# Patient Record
Sex: Female | Born: 1990 | Race: Black or African American | Hispanic: No | Marital: Single | Smoking: Current every day smoker
Health system: Southern US, Community
[De-identification: ages and names within clinical notes are randomized; demographics above are authoritative.]

## PROBLEM LIST (undated history)

## (undated) DIAGNOSIS — B977 Papillomavirus as the cause of diseases classified elsewhere: Secondary | ICD-10-CM

## (undated) DIAGNOSIS — A749 Chlamydial infection, unspecified: Secondary | ICD-10-CM

## (undated) DIAGNOSIS — K429 Umbilical hernia without obstruction or gangrene: Secondary | ICD-10-CM

## (undated) DIAGNOSIS — N189 Chronic kidney disease, unspecified: Secondary | ICD-10-CM

## (undated) DIAGNOSIS — B9689 Other specified bacterial agents as the cause of diseases classified elsewhere: Secondary | ICD-10-CM

## (undated) DIAGNOSIS — A5901 Trichomonal vulvovaginitis: Secondary | ICD-10-CM

## (undated) DIAGNOSIS — N760 Acute vaginitis: Secondary | ICD-10-CM

## (undated) DIAGNOSIS — Z789 Other specified health status: Secondary | ICD-10-CM

## (undated) DIAGNOSIS — M419 Scoliosis, unspecified: Secondary | ICD-10-CM

## (undated) HISTORY — PX: WISDOM TOOTH EXTRACTION: SHX21

## (undated) HISTORY — DX: Trichomonal vulvovaginitis: A59.01

---
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
## 2012-01-26 ENCOUNTER — Encounter (HOSPITAL_COMMUNITY): Payer: Self-pay | Admitting: Emergency Medicine

## 2012-01-26 ENCOUNTER — Emergency Department (HOSPITAL_COMMUNITY)
Admission: EM | Admit: 2012-01-26 | Discharge: 2012-01-27 | Disposition: A | Discharge status: Home / Self Care | Payer: Medicaid Other | Attending: Emergency Medicine | Admitting: Emergency Medicine

## 2012-01-26 DIAGNOSIS — B349 Viral infection, unspecified: Secondary | ICD-10-CM

## 2012-01-26 DIAGNOSIS — F17200 Nicotine dependence, unspecified, uncomplicated: Secondary | ICD-10-CM | POA: Insufficient documentation

## 2012-01-26 DIAGNOSIS — M4120 Other idiopathic scoliosis, site unspecified: Secondary | ICD-10-CM | POA: Insufficient documentation

## 2012-01-26 DIAGNOSIS — R509 Fever, unspecified: Secondary | ICD-10-CM

## 2012-01-26 DIAGNOSIS — B9789 Other viral agents as the cause of diseases classified elsewhere: Secondary | ICD-10-CM | POA: Insufficient documentation

## 2012-01-26 LAB — URINALYSIS, ROUTINE W REFLEX MICROSCOPIC
Nitrite: NEGATIVE
Protein, ur: NEGATIVE mg/dL
Specific Gravity, Urine: 1.022 (ref 1.005–1.030)
Urobilinogen, UA: 1 mg/dL (ref 0.0–1.0)

## 2012-01-26 LAB — COMPREHENSIVE METABOLIC PANEL
AST: 17 U/L (ref 0–37)
Albumin: 3.9 g/dL (ref 3.5–5.2)
Chloride: 102 mEq/L (ref 96–112)
GFR calc Af Amer: >90 mL/min (ref >90)
GFR calc non Af Amer: >90 mL/min (ref >90)
Potassium: 3.8 mEq/L (ref 3.5–5.1)
Sodium: 137 mEq/L (ref 135–145)
Total Bilirubin: 0.3 mg/dL (ref 0.3–1.2)
Total Protein: 7.8 g/dL (ref 6.0–8.3)

## 2012-01-26 LAB — POCT PREGNANCY, URINE: Preg Test, Ur: NEGATIVE

## 2012-01-26 LAB — URINE MICROSCOPIC-ADD ON

## 2012-01-26 LAB — CBC WITH DIFFERENTIAL/PLATELET
Basophils Relative: 0 % (ref 0–1)
Eosinophils Absolute: 0.1 10*3/uL (ref 0.0–0.7)
Lymphs Abs: 1.7 10*3/uL (ref 0.7–4.0)
MCH: 30.9 pg (ref 26.0–34.0)
MCHC: 34.4 g/dL (ref 30.0–36.0)
Neutro Abs: 5.3 10*3/uL (ref 1.7–7.7)
Neutrophils Relative %: 68 % (ref 43–77)
Platelets: 274 10*3/uL (ref 150–400)
RBC: 4.37 MIL/uL (ref 3.87–5.11)

## 2012-01-26 MED ORDER — ACETAMINOPHEN 325 MG PO TABS
ORAL_TABLET | ORAL | Status: AC
  Administered 2012-01-26: 650 mg via ORAL
  Filled 2012-01-26: qty 2

## 2012-01-26 MED ORDER — ACETAMINOPHEN 325 MG PO TABS
650.0000 mg | ORAL_TABLET | Freq: Once | ORAL | Status: AC
  Administered 2012-01-26: 650 mg via ORAL

## 2012-01-26 NOTE — ED Notes (Signed)
Pt requesting to leave due to wait time, RN spoke with pt and calmed her down. Pt agrred to stay a little while longer.

## 2012-01-26 NOTE — ED Notes (Signed)
Patient complaining of fever, chills, body aches, and sore throat that started yesterday morning.  Denies shortness of breath and chest pain.

## 2012-01-27 NOTE — ED Notes (Signed)
Pt discharged.GCS 15 and she says she feels better.

## 2012-01-27 NOTE — ED Notes (Signed)
Pt presented to ED with soar throat and painfull to swallow.

## 2012-01-28 NOTE — ED Provider Notes (Signed)
History     CSN: [id number]  Arrival date & time 01/26/12  2108   First MD Initiated Contact with Patient 01/27/12 0013      Chief Complaint  Patient presents with  . Fever  . Generalized Body Aches    (Consider location/radiation/quality/duration/timing/severity/associated sxs/prior treatment) HPI 21 year old female presents to emergency apartment complaining of fever chills body aches, headache, sore throat. She has had diffuse myalgias and malaise. No known sick contacts. Patient took Tylenol and Motrin once with some improvement in symptoms but none since. Patient is eating and drinking well. She denies any Raynaud's, congestion cough chest pain or abdominal pain. Past Medical History  Diagnosis Date  . Scoliosis     History reviewed. No pertinent past surgical history.  History reviewed. No pertinent family history.  History  Substance Use Topics  . Smoking status: Current Everyday Smoker  . Smokeless tobacco: Not on file  . Alcohol Use: No    OB History    Grav Para Term Preterm Abortions TAB SAB Ect Mult Living                  Review of Systems  All other systems reviewed and are negative.    Allergies  Review of patient's allergies indicates no known allergies.  Home Medications   Current Outpatient Rx  Name Route Sig Dispense Refill  . IMPLANON [place] Subcutaneous Inject 1 application into the skin as directed.    . IBUPROFEN 200 MG PO TABS Oral Take 800 mg by mouth every 8 (eight) hours as needed. For pain      BP 116/53  Pulse 103  Temp 101 F (38.3 C) (Oral)  Resp 16  SpO2 97%  LMP 12/27/2011  Physical Exam  Nursing note and vitals reviewed. Constitutional: She is oriented to person, place, and time.       Morbidly obese female in no acute distress laughing with her family  HENT:  Head: Normocephalic and atraumatic.  Right Ear: External ear normal.  Left Ear: External ear normal.  Nose: Nose normal.  Mouth/Throat: Oropharynx is clear  and moist. No oropharyngeal exudate.  Eyes: Conjunctivae are normal. Pupils are equal, round, and reactive to light.  Neck: Normal range of motion. Neck supple. No JVD present. No tracheal deviation present. No thyromegaly present.  Cardiovascular: Normal rate, regular rhythm and intact distal pulses.  Exam reveals no gallop and no friction rub.   No murmur heard. Pulmonary/Chest: Effort normal and breath sounds normal. No stridor. No respiratory distress. She has no wheezes. She has no rales. She exhibits no tenderness.  Abdominal: Soft. Bowel sounds are normal. She exhibits no distension and no mass. There is no tenderness. There is no rebound and no guarding.  Musculoskeletal: Normal range of motion. She exhibits no edema and no tenderness.  Lymphadenopathy:    She has no cervical adenopathy.  Neurological: She is alert and oriented to person, place, and time. She exhibits normal muscle tone. Coordination normal.  Skin: Skin is warm and dry. No rash noted. No erythema. No pallor.    ED Course  Procedures (including critical care time)  Labs Reviewed  URINALYSIS, ROUTINE W REFLEX MICROSCOPIC - Abnormal; Notable for the following:    APPearance CLOUDY (*)     Leukocytes, UA TRACE (*)     All other components within normal limits  URINE MICROSCOPIC-ADD ON - Abnormal; Notable for the following:    Squamous Epithelial / LPF MANY (*)     Bacteria, UA  MANY (*)     All other components within normal limits  CBC WITH DIFFERENTIAL  COMPREHENSIVE METABOLIC PANEL  POCT PREGNANCY, URINE  RAPID STREP SCREEN  LAB REPORT - SCANNED   No results found.   1. Fever   2. Viral syndrome       MDM  21 year old otherwise healthy female with viral syndrome with fever. Patient counseled drink plan fluids alternate Tylenol and Motrin for fever chills and body aches.        Olivia Mackie, MD 01/28/12 [phone]

## 2012-05-05 ENCOUNTER — Emergency Department (HOSPITAL_COMMUNITY)
Admission: EM | Admit: 2012-05-05 | Discharge: 2012-05-05 | Disposition: A | Discharge status: Home / Self Care | Payer: Medicaid Other | Attending: Emergency Medicine | Admitting: Emergency Medicine

## 2012-05-05 ENCOUNTER — Encounter (HOSPITAL_COMMUNITY): Payer: Self-pay | Admitting: *Deleted

## 2012-05-05 DIAGNOSIS — F17200 Nicotine dependence, unspecified, uncomplicated: Secondary | ICD-10-CM | POA: Insufficient documentation

## 2012-05-05 DIAGNOSIS — R21 Rash and other nonspecific skin eruption: Secondary | ICD-10-CM

## 2012-05-05 MED ORDER — DIPHENHYDRAMINE HCL 25 MG PO CAPS
25.0000 mg | ORAL_CAPSULE | Freq: Once | ORAL | Status: AC
  Administered 2012-05-05: 25 mg via ORAL
  Filled 2012-05-05: qty 1

## 2012-05-05 MED ORDER — DEXAMETHASONE 6 MG PO TABS: 6.0000 mg | ORAL_TABLET | Freq: Two times a day (BID) | ORAL | Status: DC

## 2012-05-05 MED ORDER — DEXAMETHASONE 2 MG PO TABS
6.0000 mg | ORAL_TABLET | Freq: Once | ORAL | Status: AC
  Administered 2012-05-05: 6 mg via ORAL
  Filled 2012-05-05: qty 3

## 2012-05-05 MED ORDER — DIPHENHYDRAMINE HCL 25 MG PO TABS: 25.0000 mg | ORAL_TABLET | ORAL | Status: DC | PRN

## 2012-05-05 NOTE — ED Provider Notes (Signed)
History     CSN: [id number]  Arrival date & time 05/05/12  2306   First MD Initiated Contact with Patient 05/05/12 2316      Chief Complaint  Patient presents with  . Rash    (Consider location/radiation/quality/duration/timing/severity/associated sxs/prior treatment) HPI Comments: 21 year old female presents emergency department complaining of a rash on her left arm for the past 2 weeks. She states when he she just noticed a rash, and states it has not spread or changed. She describes rash as small bumps located on her upper arm. It is very itchy but not painful. Denies new soaps, detergents, pets or recent travel. No new medications or recent illness. Denies any associated symptoms including fever, chills, difficulty breathing or swallowing. She has not tried any alleviating factors.  Patient is a 21 y.o. female presenting with rash. The history is provided by the patient.  Rash     Past Medical History  Diagnosis Date  . Scoliosis     History reviewed. No pertinent past surgical history.  History reviewed. No pertinent family history.  History  Substance Use Topics  . Smoking status: Current Every Day Smoker  . Smokeless tobacco: Not on file  . Alcohol Use: No    OB History    Grav Para Term Preterm Abortions TAB SAB Ect Mult Living                  Review of Systems  Constitutional: Negative for fever and chills.  HENT: Negative for facial swelling, trouble swallowing, neck pain and neck stiffness.   Respiratory: Negative for shortness of breath.   Cardiovascular: Negative for chest pain and palpitations.  Gastrointestinal: Negative for nausea and vomiting.  Musculoskeletal: Negative for arthralgias.  Skin: Positive for rash. Negative for color change.    Allergies  Review of patient's allergies indicates no known allergies.  Home Medications   Current Outpatient Rx  Name Route Sig Dispense Refill  . IMPLANON [place] Subcutaneous Inject 1 application into  the skin as directed.    . IBUPROFEN 200 MG PO TABS Oral Take 800 mg by mouth every 8 (eight) hours as needed. For pain      BP 107/72  Pulse 88  Temp 98.4 F (36.9 C) (Oral)  Resp 16  SpO2 100%  Physical Exam  Nursing note and vitals reviewed. Constitutional: She is oriented to person, place, and time. She appears well-developed and well-nourished. No distress.  HENT:  Head: Normocephalic and atraumatic.  Mouth/Throat: Oropharynx is clear and moist. No oral lesions.  Eyes: Conjunctivae normal and EOM are normal.  Neck: Normal range of motion. Neck supple.  Cardiovascular: Normal rate, regular rhythm and normal heart sounds.   Pulmonary/Chest: Effort normal and breath sounds normal.  Musculoskeletal: Normal range of motion.  Neurological: She is alert and oriented to person, place, and time.  Skin: Skin is warm, dry and intact. Rash noted. Rash is maculopapular (pinpoint maculopapular lesions on lateral aspect of left arm. no erythema or warmth.). She is not diaphoretic.  Psychiatric: She has a normal mood and affect. Her speech is normal and behavior is normal.    ED Course  Procedures (including critical care time)  Labs Reviewed - No data to display No results found.   1. Rash/skin eruption       MDM  21 year old female with maculopapular rash that is itchy. She has not had any alleviating factors. No new soaps, detergents, perhaps, recent illness or travel. No new medications. She's afebrile with normal  vital signs and in no apparent distress. There is no evidence of secondary infection. I will treat her with oral Decadron in the emergency department along with a prescription for one more Decadron to take tomorrow. I also prescribed Benadryl for her itching. Return precautions discussed.      Trevor Mace, PA-C 05/05/12 2337

## 2012-05-05 NOTE — ED Notes (Signed)
Pt states left arm rash for the past two weeks. Rash is small bumps localized on pt left upper arm.

## 2012-05-05 NOTE — ED Notes (Signed)
Pt c/o rash on left upper arm x's 2 weeks.

## 2012-05-06 NOTE — ED Provider Notes (Signed)
Medical screening examination/treatment/procedure(s) were performed by non-physician practitioner and as supervising physician I was immediately available for consultation/collaboration.  Olivia Mackie, MD 05/06/12 [phone]

## 2012-06-22 ENCOUNTER — Encounter: Payer: Medicaid Other | Admitting: Family Medicine

## 2012-07-03 ENCOUNTER — Encounter: Payer: Medicaid Other | Admitting: Family Medicine

## 2012-07-29 ENCOUNTER — Encounter: Payer: Medicaid Other | Admitting: Family Medicine

## 2012-08-06 ENCOUNTER — Encounter: Payer: Medicaid Other | Admitting: Family Medicine

## 2012-08-15 ENCOUNTER — Inpatient Hospital Stay (HOSPITAL_COMMUNITY)
Admission: AD | Admit: 2012-08-15 | Discharge: 2012-08-15 | Disposition: A | Discharge status: Home / Self Care | Payer: Medicaid Other | Source: Ambulatory Visit | Attending: Obstetrics and Gynecology | Admitting: Obstetrics and Gynecology

## 2012-08-15 ENCOUNTER — Encounter (HOSPITAL_COMMUNITY): Payer: Self-pay

## 2012-08-15 DIAGNOSIS — R109 Unspecified abdominal pain: Secondary | ICD-10-CM | POA: Insufficient documentation

## 2012-08-15 DIAGNOSIS — J069 Acute upper respiratory infection, unspecified: Secondary | ICD-10-CM | POA: Insufficient documentation

## 2012-08-15 DIAGNOSIS — N72 Inflammatory disease of cervix uteri: Secondary | ICD-10-CM

## 2012-08-15 DIAGNOSIS — R102 Pelvic and perineal pain: Secondary | ICD-10-CM

## 2012-08-15 DIAGNOSIS — N949 Unspecified condition associated with female genital organs and menstrual cycle: Secondary | ICD-10-CM | POA: Insufficient documentation

## 2012-08-15 HISTORY — DX: Other specified health status: Z78.9

## 2012-08-15 LAB — URINALYSIS, ROUTINE W REFLEX MICROSCOPIC
Ketones, ur: NEGATIVE mg/dL
Protein, ur: NEGATIVE mg/dL
Urobilinogen, UA: 0.2 mg/dL (ref 0.0–1.0)

## 2012-08-15 LAB — WET PREP, GENITAL
Trich, Wet Prep: NONE SEEN
Yeast Wet Prep HPF POC: NONE SEEN

## 2012-08-15 LAB — CBC WITH DIFFERENTIAL/PLATELET
Basophils Absolute: 0 10*3/uL (ref 0.0–0.1)
Eosinophils Absolute: 0.2 10*3/uL (ref 0.0–0.7)
Eosinophils Relative: 4 % (ref 0–5)
Lymphs Abs: 2.8 10*3/uL (ref 0.7–4.0)
MCH: 30.1 pg (ref 26.0–34.0)
MCV: 92.8 fL (ref 78.0–100.0)
Neutrophils Relative %: 40 % — ABNORMAL LOW (ref 43–77)
Platelets: 277 10*3/uL (ref 150–400)
RBC: 4.05 MIL/uL (ref 3.87–5.11)
RDW: 12.8 % (ref 11.5–15.5)
WBC: 6.3 10*3/uL (ref 4.0–10.5)

## 2012-08-15 LAB — POCT PREGNANCY, URINE: Preg Test, Ur: NEGATIVE

## 2012-08-15 MED ORDER — AZITHROMYCIN 250 MG PO TABS
1000.0000 mg | ORAL_TABLET | Freq: Once | ORAL | Status: AC
  Administered 2012-08-15: 1000 mg via ORAL
  Filled 2012-08-15: qty 4

## 2012-08-15 MED ORDER — CEFTRIAXONE SODIUM 250 MG IJ SOLR
250.0000 mg | Freq: Once | INTRAMUSCULAR | Status: DC
Start: 2012-08-15 — End: 2012-08-15
  Filled 2012-08-15: qty 250

## 2012-08-15 MED ORDER — NAPROXEN SODIUM 550 MG PO TABS: 550.0000 mg | ORAL_TABLET | Freq: Two times a day (BID) | ORAL | Status: AC

## 2012-08-15 NOTE — MAU Provider Note (Signed)
Attestation of Attending Supervision of Advanced Practitioner (CNM/NP): Evaluation and management procedures were performed by the Advanced Practitioner under my supervision and collaboration.  I have reviewed the Advanced Practitioner's note and chart, and I agree with the management and plan.  Lynsee Wands 08/15/2012 9:30 PM

## 2012-08-15 NOTE — MAU Note (Signed)
Pt states that she has been having abdominal for a couple of days. States home pregnancy test was positive about 1 month ago

## 2012-08-15 NOTE — MAU Provider Note (Signed)
History     CSN: [id number]  Arrival date and time: 08/15/12 1558   First Provider Initiated Contact with Patient 08/15/12 1734      Chief Complaint  Patient presents with  . Abdominal Pain   HPI Catherine Morales is a 22 y.o. female who presents to MAU with pelvic pain. The pain has been going on a couple days. She had vaginal bleeding last week and thinks it was her period but was heavier than usual so she wants a pregnancy test. She is having some pain when she has sexual intercourse. She is here with 2 of her friend that are also patients in MAU. She rates her pain as 8/10. She is texting during the interview and I have to repeat question. She also has a cold and congestion. The history was provided by the patient.   OB History    Grav Para Term Preterm Abortions TAB SAB Ect Mult Living   1 1 1       1       Past Medical History  Diagnosis Date  . Scoliosis   . No pertinent past medical history     No past surgical history on file.  No family history on file.  History  Substance Use Topics  . Smoking status: Current Every Day Smoker  . Smokeless tobacco: Not on file  . Alcohol Use: No    Allergies: No Known Allergies  Prescriptions prior to admission  Medication Sig Dispense Refill  . Etonogestrel (IMPLANON [place]) Inject 1 application into the skin as directed.        Review of Systems  Constitutional: Negative for fever.  HENT: Positive for congestion.   Eyes: Negative for blurred vision and double vision.  Respiratory: Positive for cough. Negative for wheezing.   Cardiovascular: Negative for chest pain and palpitations.  Gastrointestinal: Positive for abdominal pain. Negative for nausea, vomiting, diarrhea and constipation.  Genitourinary: Negative for dysuria and frequency.  Musculoskeletal: Positive for back pain.  Skin: Negative for rash.  Neurological: Positive for headaches. Negative for dizziness and seizures.  Psychiatric/Behavioral: Negative for  depression. The patient is not nervous/anxious and does not have insomnia.    Blood pressure 119/60, pulse 82, temperature 98 F (36.7 C), temperature source Oral, resp. rate 16, height 5\' 6"  (1.676 m), weight 220 lb (99.791 kg), last menstrual period 06/03/2012.  Physical Exam  Constitutional: She is oriented to person, place, and time. She appears well-developed and well-nourished. No distress.  HENT:  Head: Normocephalic and atraumatic.  Eyes: EOM are normal.  Neck: Neck supple.  Cardiovascular: Normal rate.   Respiratory: Effort normal.  GI: Soft. Normal appearance. There is tenderness in the right lower quadrant, suprapubic area and left lower quadrant. There is no CVA tenderness.  Genitourinary:       External genitalia without lesions. Scant blood vaginal vault. Cervix inflamed, positive CMT, Bilateral adnexal tenderness. Uterus without palpable enlargement.  Musculoskeletal: Normal range of motion.  Neurological: She is alert and oriented to person, place, and time.  Skin: Skin is warm and dry.  Psychiatric: She has a normal mood and affect. Her behavior is normal. Judgment and thought content normal.   Results for orders placed during the hospital encounter of 08/15/12 (from the past 24 hour(s))  URINALYSIS, ROUTINE W REFLEX MICROSCOPIC     Status: Abnormal   Collection Time   08/15/12  5:05 PM      Component Value Range   Color, Urine YELLOW  YELLOW  APPearance CLOUDY (*) CLEAR   Specific Gravity, Urine 1.025  1.005 - 1.030   pH 7.5  5.0 - 8.0   Glucose, UA NEGATIVE  NEGATIVE mg/dL   Hgb urine dipstick NEGATIVE  NEGATIVE   Bilirubin Urine NEGATIVE  NEGATIVE   Ketones, ur NEGATIVE  NEGATIVE mg/dL   Protein, ur NEGATIVE  NEGATIVE mg/dL   Urobilinogen, UA 0.2  0.0 - 1.0 mg/dL   Nitrite NEGATIVE  NEGATIVE   Leukocytes, UA TRACE (*) NEGATIVE  URINE MICROSCOPIC-ADD ON     Status: Abnormal   Collection Time   08/15/12  5:05 PM      Component Value Range   Squamous  Epithelial / LPF FEW (*) RARE   WBC, UA 0-2  <3 WBC/hpf   Urine-Other MUCOUS PRESENT    POCT PREGNANCY, URINE     Status: Normal   Collection Time   08/15/12  5:14 PM      Component Value Range   Preg Test, Ur NEGATIVE  NEGATIVE  CBC WITH DIFFERENTIAL     Status: Abnormal   Collection Time   08/15/12  5:46 PM      Component Value Range   WBC 6.3  4.0 - 10.5 K/uL   RBC 4.05  3.87 - 5.11 MIL/uL   Hemoglobin 12.2  12.0 - 15.0 g/dL   HCT 16.1  09.6 - 04.5 %   MCV 92.8  78.0 - 100.0 fL   MCH 30.1  26.0 - 34.0 pg   MCHC 32.4  30.0 - 36.0 g/dL   RDW 40.9  81.1 - 91.4 %   Platelets 277  150 - 400 K/uL   Neutrophils Relative 40 (*) 43 - 77 %   Neutro Abs 2.5  1.7 - 7.7 K/uL   Lymphocytes Relative 45  12 - 46 %   Lymphs Abs 2.8  0.7 - 4.0 K/uL   Monocytes Relative 12  3 - 12 %   Monocytes Absolute 0.7  0.1 - 1.0 K/uL   Eosinophils Relative 4  0 - 5 %   Eosinophils Absolute 0.2  0.0 - 0.7 K/uL   Basophils Relative 1  0 - 1 %   Basophils Absolute 0.0  0.0 - 0.1 K/uL  WET PREP, GENITAL     Status: Abnormal   Collection Time   08/15/12  6:17 PM      Component Value Range   Yeast Wet Prep HPF POC NONE SEEN  NONE SEEN   Trich, Wet Prep NONE SEEN  NONE SEEN   Clue Cells Wet Prep HPF POC FEW (*) NONE SEEN   WBC, Wet Prep HPF POC FEW (*) NONE SEEN    Procedures Assessment: 22 y.o. female with pelvic pain   URI  Plan:  Rocephin 250 mg IM   (patient refused)   Zithromax 1 gram PO  I discussed with the patient in detail that since she has had STD's in the past that she may have the pain and cervicitis due to another infection. I discussed with her that if she does not take the antibiotics that her symptoms may worsen.     Medication List     As of 08/15/2012  6:58 PM    START taking these medications         naproxen sodium 550 MG tablet   Commonly known as: ANAPROX   Take 1 tablet (550 mg total) by mouth 2 (two) times daily with a meal.      CONTINUE taking these medications  IMPLANON [place]          Where to get your medications    These are the prescriptions that you need to pick up. We sent them to a specific pharmacy, so you will need to go there to get them.   [id number] - [place], [place] - [address] AT [facility] [address]    [address] [place] [address]    Phone: [phone]        naproxen sodium 550 MG tablet            NEESE,HOPE, RN, FNP, [facility] 08/15/2012, 6:56 PM

## 2012-08-18 LAB — GC/CHLAMYDIA PROBE AMP: CT Probe RNA: NEGATIVE

## 2012-08-27 ENCOUNTER — Encounter: Payer: Medicaid Other | Admitting: Family Medicine

## 2014-05-23 ENCOUNTER — Encounter (HOSPITAL_COMMUNITY): Payer: Self-pay

## 2014-11-17 ENCOUNTER — Emergency Department (HOSPITAL_COMMUNITY)
Admission: EM | Admit: 2014-11-17 | Discharge: 2014-11-18 | Disposition: A | Discharge status: Home / Self Care | Payer: Medicaid Other | Attending: Emergency Medicine | Admitting: Emergency Medicine

## 2014-11-17 ENCOUNTER — Emergency Department (HOSPITAL_COMMUNITY): Payer: Medicaid Other

## 2014-11-17 ENCOUNTER — Encounter (HOSPITAL_COMMUNITY): Payer: Self-pay | Admitting: *Deleted

## 2014-11-17 DIAGNOSIS — Z3202 Encounter for pregnancy test, result negative: Secondary | ICD-10-CM | POA: Diagnosis not present

## 2014-11-17 DIAGNOSIS — M419 Scoliosis, unspecified: Secondary | ICD-10-CM | POA: Diagnosis not present

## 2014-11-17 DIAGNOSIS — S6392XA Sprain of unspecified part of left wrist and hand, initial encounter: Secondary | ICD-10-CM | POA: Diagnosis not present

## 2014-11-17 DIAGNOSIS — Z720 Tobacco use: Secondary | ICD-10-CM | POA: Diagnosis not present

## 2014-11-17 DIAGNOSIS — Y929 Unspecified place or not applicable: Secondary | ICD-10-CM | POA: Diagnosis not present

## 2014-11-17 DIAGNOSIS — Y999 Unspecified external cause status: Secondary | ICD-10-CM | POA: Diagnosis not present

## 2014-11-17 DIAGNOSIS — S6992XA Unspecified injury of left wrist, hand and finger(s), initial encounter: Secondary | ICD-10-CM | POA: Diagnosis present

## 2014-11-17 DIAGNOSIS — Y939 Activity, unspecified: Secondary | ICD-10-CM | POA: Insufficient documentation

## 2014-11-17 DIAGNOSIS — M79642 Pain in left hand: Secondary | ICD-10-CM

## 2014-11-17 DIAGNOSIS — W1839XA Other fall on same level, initial encounter: Secondary | ICD-10-CM | POA: Insufficient documentation

## 2014-11-17 LAB — POC URINE PREG, ED: Preg Test, Ur: NEGATIVE

## 2014-11-17 NOTE — ED Notes (Addendum)
Pt in c/o injury to her left hand and wrist after a fall tonight, swelling noted, CMS intact, no distress

## 2014-11-18 MED ORDER — NAPROXEN 250 MG PO TABS
500.0000 mg | ORAL_TABLET | Freq: Once | ORAL | Status: AC
  Administered 2014-11-18: 500 mg via ORAL
  Filled 2014-11-18: qty 2

## 2014-11-18 MED ORDER — NAPROXEN 500 MG PO TABS: 500.0000 mg | ORAL_TABLET | Freq: Two times a day (BID) | ORAL | Status: DC | PRN

## 2014-11-18 NOTE — Progress Notes (Signed)
Orthopedic Tech Progress Note Patient Details:  Catherine Morales April 24, 1991 [id number]  Ortho Devices Ortho Device/Splint Interventions: Application   Haskell FlirtNewsome, Bach Rocchi M 11/18/2014, 12:44 AM

## 2014-11-18 NOTE — Discharge Instructions (Signed)
Wear wrist brace as needed for comfort. Ice and elevate hand/wrist throughout the day. Alternate between naprosyn and tylenol for pain. Call orthopedic follow up today or tomorrow to schedule followup appointment for recheck of ongoing wrist/hand pain in 1-2 weeks that can be canceled with a 24-48 hour notice if complete resolution of pain. Return to the ER for changes or worsening symptoms.   Musculoskeletal Pain Musculoskeletal pain is muscle and boney aches and pains. These pains can occur in any part of the body. Your caregiver may treat you without knowing the cause of the pain. They may treat you if blood or urine tests, X-rays, and other tests were normal.  CAUSES There is often not a definite cause or reason for these pains. These pains may be caused by a type of germ (virus). The discomfort may also come from overuse. Overuse includes working out too hard when your body is not fit. Boney aches also come from weather changes. Bone is sensitive to atmospheric pressure changes. HOME CARE INSTRUCTIONS   Ask when your test results will be ready. Make sure you get your test results.  Only take over-the-counter or prescription medicines for pain, discomfort, or fever as directed by your caregiver. If you were given medications for your condition, do not drive, operate machinery or power tools, or sign legal documents for 24 hours. Do not drink alcohol. Do not take sleeping pills or other medications that may interfere with treatment.  Continue all activities unless the activities cause more pain. When the pain lessens, slowly resume normal activities. Gradually increase the intensity and duration of the activities or exercise.  During periods of severe pain, bed rest may be helpful. Lay or sit in any position that is comfortable.  Putting ice on the injured area.  Put ice in a bag.  Place a towel between your skin and the bag.  Leave the ice on for 15 to 20 minutes, 3 to 4 times a  day.  Follow up with your caregiver for continued problems and no reason can be found for the pain. If the pain becomes worse or does not go away, it may be necessary to repeat tests or do additional testing. Your caregiver may need to look further for a possible cause. SEEK IMMEDIATE MEDICAL CARE IF:  You have pain that is getting worse and is not relieved by medications.  You develop chest pain that is associated with shortness or breath, sweating, feeling sick to your stomach (nauseous), or throw up (vomit).  Your pain becomes localized to the abdomen.  You develop any new symptoms that seem different or that concern you. MAKE SURE YOU:   Understand these instructions.  Will watch your condition.  Will get help right away if you are not doing well or get worse. Document Released: 07/08/2005 Document Revised: 09/30/2011 Document Reviewed: 03/12/2013 [facility] Patient Information 2015 [place], [place]. This information is not intended to replace advice given to you by your health care provider. Make sure you discuss any questions you have with your health care provider.  Cryotherapy Cryotherapy is when you put ice on your injury. Ice helps lessen pain and puffiness (swelling) after an injury. Ice works the best when you start using it in the first 24 to 48 hours after an injury. HOME CARE  Put a dry or damp towel between the ice pack and your skin.  You may press gently on the ice pack.  Leave the ice on for no more than 10 to 20  minutes at a time.  Check your skin after 5 minutes to make sure your skin is okay.  Rest at least 20 minutes between ice pack uses.  Stop using ice when your skin loses feeling (numbness).  Do not use ice on someone who cannot tell you when it hurts. This includes small children and people with memory problems (dementia). GET HELP RIGHT AWAY IF:  You have white spots on your skin.  Your skin turns blue or pale.  Your skin feels waxy or  hard.  Your puffiness gets worse. MAKE SURE YOU:   Understand these instructions.  Will watch your condition.  Will get help right away if you are not doing well or get worse. Document Released: 12/25/2007 Document Revised: 09/30/2011 Document Reviewed: 02/28/2011 [facility] Patient Information 2015 [place], [place]. This information is not intended to replace advice given to you by your health care provider. Make sure you discuss any questions you have with your health care provider.

## 2014-11-18 NOTE — ED Provider Notes (Signed)
CSN: [id number]     Arrival date & time 11/17/14  2255 History   First MD Initiated Contact with Patient 11/18/14 0021     Chief Complaint  Patient presents with  . Hand Injury     (Consider location/radiation/quality/duration/timing/severity/associated sxs/prior Treatment) HPI Comments: Catherine Morales is a 24 y.o. female with a PMHx of scoliosis, who presents to the ED with complaints of left hand pain that began after she fell on outstretched hand approximately 2 hours prior to arrival. She reports that the pain is 8/10 sore, constant, located to the fourth and fifth metacarpals, nonradiating, worse with movement, and with no known alleviating factors given that she has not tried anything prior to arrival. She endorses associated swelling, and pain with movement of her fingers but able to still wiggle them. She denies any numbness, tingling, weakness, head injury or loss of consciousness, elbow pain, wrist pain, back or neck pain, abrasions, or bruising. No skin injury. No other injuries sustained.  Patient is a 24 y.o. female presenting with hand injury. The history is provided by the patient. No language interpreter was used.  Hand Injury Location:  Hand Time since incident:  2 hours Injury: yes   Mechanism of injury comment:  FOOSH Hand location:  L hand Pain details:    Quality: soreness.   Radiates to:  Does not radiate   Severity:  Moderate   Onset quality:  Sudden   Duration:  2 hours   Timing:  Constant   Progression:  Unchanged Chronicity:  New Prior injury to area:  No Relieved by:  None tried Worsened by:  Movement Ineffective treatments:  None tried Associated symptoms: decreased range of motion (due to pain) and swelling   Associated symptoms: no back pain, no muscle weakness, no neck pain, no numbness and no tingling     Past Medical History  Diagnosis Date  . Scoliosis   . No pertinent past medical history    History reviewed. No pertinent past surgical  history. History reviewed. No pertinent family history. History  Substance Use Topics  . Smoking status: Current Every Day Smoker  . Smokeless tobacco: Not on file  . Alcohol Use: No   OB History    Gravida Para Term Preterm AB TAB SAB Ectopic Multiple Living   Review of Systems  HENT: Negative for facial swelling (no head injury).   Musculoskeletal: Positive for joint swelling (L hand) and arthralgias (L hand). Negative for myalgias, back pain and neck pain.  Skin: Negative for color change and wound.  Allergic/Immunologic: Negative for immunocompromised state.  Neurological: Negative for syncope, weakness, numbness and headaches.  Psychiatric/Behavioral: Negative for confusion.   10 Systems reviewed and are negative for acute change except as noted in the HPI.    Allergies  Review of patient's allergies indicates no known allergies.  Home Medications   Prior to Admission medications   Medication Sig Start Date End Date Taking? Authorizing Provider  Etonogestrel (IMPLANON [place]) Inject 1 application into the skin as directed.    Historical Provider, MD   BP 121/84 mmHg  Pulse 75  Temp(Src) 98.5 F (36.9 C) (Oral)  Resp 20  Ht  (1.651 m)  SpO2 99%  LMP 09/27/2014 Physical Exam  Constitutional: She is oriented to person, place, and time. Vital signs are normal. She appears well-developed and well-nourished.  Non-toxic appearance. No distress.  Afebrile, nontoxic, NAD  HENT:  Head: Normocephalic and atraumatic.  Mouth/Throat: Mucous membranes are normal.  Eyes: Conjunctivae and EOM are normal. Right eye exhibits no discharge. Left eye exhibits no discharge.  Neck: Normal range of motion. Neck supple.  Cardiovascular: Normal rate and intact distal pulses.   Pulmonary/Chest: Effort normal. No respiratory distress.  Abdominal: Normal appearance. She exhibits no distension.  Musculoskeletal: Normal range of motion.       Left hand: She exhibits  tenderness and bony tenderness. She exhibits normal range of motion, normal two-point discrimination, normal capillary refill, no deformity, no laceration and no swelling. Normal sensation noted. Normal strength noted.       Hands: L hand with FROM intact at all digits at PIP, MCP, and DIP joints, with mild TTP over 3rd-5th metacarpals, no deformities, no abrasions or skin changes, no swelling or bruising, cap refill brisk and present, distal pulses intact, strength and sensation grossly intact. L wrist and elbow nonTTP with FROM intact. Wiggles all digits.  Neurological: She is alert and oriented to person, place, and time. She has normal strength. No sensory deficit.  Skin: Skin is warm, dry and intact. No abrasion, no bruising and no rash noted.  No skin injury or bruising to L hand or any other exposed skin surfaces  Psychiatric: She has a normal mood and affect. Her behavior is normal.  Nursing note and vitals reviewed.   ED Course  Procedures (including critical care time) Labs Review Labs Reviewed  POC URINE PREG, ED    Imaging Review Dg Wrist Complete Left  11/17/2014   CLINICAL DATA:  24 year old female with left hand and wrist injury after falling tonight on the hardwood floor  EXAM: LEFT WRIST - COMPLETE 3+ VIEW  COMPARISON:  Concurrently obtained radiographs of the hand  FINDINGS: There is no evidence of fracture or dislocation. There is no evidence of arthropathy or other focal bone abnormality. Soft tissues are unremarkable.  IMPRESSION: Negative.   Electronically Signed   By: Malachy MoanHeath  McCullough M.D.   On: 11/17/2014 23:50   Dg Hand Complete Left  11/17/2014   CLINICAL DATA:  24 year old female with left hand and wrist pain after falling tonight tunnel hardwood floor  EXAM: LEFT HAND - COMPLETE 3+ VIEW  COMPARISON:  Concurrently obtained radiographs of the wrist  FINDINGS: There is no evidence of fracture or dislocation. There is no evidence of arthropathy or other focal bone  abnormality. Soft tissues are unremarkable.  IMPRESSION: Negative.   Electronically Signed   By: Malachy MoanHeath  McCullough M.D.   On: 11/17/2014 23:51     EKG Interpretation None      MDM   Final diagnoses:  Left hand pain  Hand sprain, left, initial encounter    24 y.o. female here with L hand pain after FOOSH injury. Neurovascularly intact with soft compartments. Minimal TTP to 3rd-5th metacarpals, FROM intact at all joints. Xrays negative. Will give naprosyn here, discussed RICE therapy and tylenol/naprosyn for pain. Will have her f/up with hand surgeon in 1-2 weeks for ongoing symptoms. Wrist splint provided for comfort. I explained the diagnosis and have given explicit precautions to return to the ER including for any other new or worsening symptoms. The patient understands and accepts the medical plan as it's been dictated and I have answered their questions. Discharge instructions concerning home care and prescriptions have been given. The patient is STABLE and is discharged to home in good condition.  BP 121/84 mmHg  Pulse 75  Temp(Src) 98.5 F (36.9 C) (Oral)  Resp 20  Ht  (1.651 m)  SpO2 99%  LMP 09/27/2014  Meds ordered this encounter  Medications  . naproxen (NAPROSYN) tablet 500 mg    Sig:   . naproxen (NAPROSYN) 500 MG tablet    Sig: Take 1 tablet (500 mg total) by mouth 2 (two) times daily as needed for mild pain, moderate pain or headache (TAKE WITH MEALS.).    Dispense:  20 tablet    Refill:  0       Cordell Guercio Camprubi-Soms, PA-C 11/18/14 1610  Loren Racer, MD 11/18/14 [phone]

## 2015-04-02 ENCOUNTER — Emergency Department (HOSPITAL_COMMUNITY): Payer: Medicaid Other

## 2015-04-02 ENCOUNTER — Observation Stay (HOSPITAL_COMMUNITY)
Admission: EM | Admit: 2015-04-02 | Discharge: 2015-04-03 | Disposition: A | Discharge status: Home / Self Care | Payer: Medicaid Other | Attending: Internal Medicine | Admitting: Internal Medicine

## 2015-04-02 ENCOUNTER — Encounter (HOSPITAL_COMMUNITY): Payer: Self-pay | Admitting: Emergency Medicine

## 2015-04-02 DIAGNOSIS — E872 Acidosis: Secondary | ICD-10-CM | POA: Insufficient documentation

## 2015-04-02 DIAGNOSIS — R05 Cough: Secondary | ICD-10-CM | POA: Insufficient documentation

## 2015-04-02 DIAGNOSIS — R0602 Shortness of breath: Secondary | ICD-10-CM | POA: Diagnosis not present

## 2015-04-02 DIAGNOSIS — B9620 Unspecified Escherichia coli [E. coli] as the cause of diseases classified elsewhere: Secondary | ICD-10-CM | POA: Diagnosis not present

## 2015-04-02 DIAGNOSIS — F17200 Nicotine dependence, unspecified, uncomplicated: Secondary | ICD-10-CM | POA: Insufficient documentation

## 2015-04-02 DIAGNOSIS — R509 Fever, unspecified: Secondary | ICD-10-CM | POA: Insufficient documentation

## 2015-04-02 DIAGNOSIS — M419 Scoliosis, unspecified: Secondary | ICD-10-CM | POA: Diagnosis not present

## 2015-04-02 DIAGNOSIS — N390 Urinary tract infection, site not specified: Secondary | ICD-10-CM | POA: Diagnosis not present

## 2015-04-02 DIAGNOSIS — N12 Tubulo-interstitial nephritis, not specified as acute or chronic: Secondary | ICD-10-CM | POA: Diagnosis present

## 2015-04-02 HISTORY — DX: Tubulo-interstitial nephritis, not specified as acute or chronic: N12

## 2015-04-02 LAB — URINE MICROSCOPIC-ADD ON

## 2015-04-02 LAB — CBC
HCT: 35.7 % — ABNORMAL LOW (ref 36.0–46.0)
Hemoglobin: 11.9 g/dL — ABNORMAL LOW (ref 12.0–15.0)
MCH: 30.7 pg (ref 26.0–34.0)
MCHC: 33.3 g/dL (ref 30.0–36.0)
MCV: 92.2 fL (ref 78.0–100.0)
PLATELETS: 271 10*3/uL (ref 150–400)
RBC: 3.87 MIL/uL (ref 3.87–5.11)
RDW: 12.5 % (ref 11.5–15.5)
WBC: 7.7 10*3/uL (ref 4.0–10.5)

## 2015-04-02 LAB — LIPASE, BLOOD: Lipase: 21 U/L — ABNORMAL LOW (ref 22–51)

## 2015-04-02 LAB — URINALYSIS, ROUTINE W REFLEX MICROSCOPIC
BILIRUBIN URINE: NEGATIVE
Glucose, UA: NEGATIVE mg/dL
KETONES UR: 15 mg/dL — AB
NITRITE: POSITIVE — AB
PH: 6 (ref 5.0–8.0)
PROTEIN: 30 mg/dL — AB
Specific Gravity, Urine: 1.026 (ref 1.005–1.030)
UROBILINOGEN UA: 0.2 mg/dL (ref 0.0–1.0)

## 2015-04-02 LAB — COMPREHENSIVE METABOLIC PANEL
ALK PHOS: 65 U/L (ref 38–126)
ALT: 14 U/L (ref 14–54)
AST: 25 U/L (ref 15–41)
Albumin: 3.5 g/dL (ref 3.5–5.0)
Anion gap: 9 (ref 5–15)
BILIRUBIN TOTAL: 0.9 mg/dL (ref 0.3–1.2)
BUN: 7 mg/dL (ref 6–20)
CALCIUM: 9.1 mg/dL (ref 8.9–10.3)
CO2: 23 mmol/L (ref 22–32)
CREATININE: 0.85 mg/dL (ref 0.44–1.00)
Chloride: 104 mmol/L (ref 101–111)
Glucose, Bld: 132 mg/dL — ABNORMAL HIGH (ref 65–99)
Potassium: 3.6 mmol/L (ref 3.5–5.1)
Sodium: 136 mmol/L (ref 135–145)
TOTAL PROTEIN: 6.7 g/dL (ref 6.5–8.1)

## 2015-04-02 LAB — I-STAT CG4 LACTIC ACID, ED
Lactic Acid, Venous: 3.65 mmol/L (ref 0.5–2.0)
Lactic Acid, Venous: 0.65 mmol/L (ref 0.5–2.0)

## 2015-04-02 LAB — I-STAT BETA HCG BLOOD, ED (MC, WL, AP ONLY): I-stat hCG, quantitative: <5 m[IU]/mL (ref <5)

## 2015-04-02 MED ORDER — SODIUM CHLORIDE 0.9 % IV BOLUS (SEPSIS)
2000.0000 mL | Freq: Once | INTRAVENOUS | Status: AC
Start: 2015-04-02 — End: 2015-04-02
  Administered 2015-04-02: 2000 mL via INTRAVENOUS

## 2015-04-02 MED ORDER — KETOROLAC TROMETHAMINE 30 MG/ML IJ SOLN
30.0000 mg | Freq: Once | INTRAMUSCULAR | Status: AC
  Administered 2015-04-02: 30 mg via INTRAVENOUS
  Filled 2015-04-02: qty 1

## 2015-04-02 MED ORDER — CEPHALEXIN 500 MG PO CAPS: 500.0000 mg | ORAL_CAPSULE | Freq: Four times a day (QID) | ORAL | Status: DC

## 2015-04-02 MED ORDER — ACETAMINOPHEN 325 MG PO TABS
650.0000 mg | ORAL_TABLET | Freq: Four times a day (QID) | ORAL | Status: DC | PRN
  Administered 2015-04-02: 650 mg via ORAL

## 2015-04-02 MED ORDER — SODIUM CHLORIDE 0.9 % IV BOLUS (SEPSIS)
500.0000 mL | Freq: Once | INTRAVENOUS | Status: AC
  Administered 2015-04-02: 500 mL via INTRAVENOUS

## 2015-04-02 MED ORDER — SODIUM CHLORIDE 0.9 % IV SOLN
INTRAVENOUS | Status: AC
  Administered 2015-04-02 – 2015-04-03 (×2): via INTRAVENOUS

## 2015-04-02 MED ORDER — ACETAMINOPHEN 500 MG PO TABS
1000.0000 mg | ORAL_TABLET | Freq: Once | ORAL | Status: AC
Start: 2015-04-02 — End: 2015-04-02
  Administered 2015-04-02: 1000 mg via ORAL
  Filled 2015-04-02: qty 2

## 2015-04-02 MED ORDER — DEXTROSE 5 % IV SOLN
1.0000 g | INTRAVENOUS | Status: DC
  Administered 2015-04-03: 1 g via INTRAVENOUS
  Filled 2015-04-02: qty 10

## 2015-04-02 MED ORDER — MORPHINE SULFATE (PF) 4 MG/ML IV SOLN
4.0000 mg | Freq: Once | INTRAVENOUS | Status: AC
  Administered 2015-04-02: 4 mg via INTRAVENOUS
  Filled 2015-04-02: qty 1

## 2015-04-02 MED ORDER — SODIUM CHLORIDE 0.9 % IV BOLUS (SEPSIS)
2000.0000 mL | Freq: Once | INTRAVENOUS | Status: AC
  Administered 2015-04-02: 2000 mL via INTRAVENOUS

## 2015-04-02 MED ORDER — ACETAMINOPHEN 500 MG PO TABS
1000.0000 mg | ORAL_TABLET | Freq: Once | ORAL | Status: AC
  Administered 2015-04-02: 1000 mg via ORAL
  Filled 2015-04-02: qty 2

## 2015-04-02 MED ORDER — DEXTROSE 5 % IV SOLN
1.0000 g | Freq: Once | INTRAVENOUS | Status: AC
  Administered 2015-04-02: 1 g via INTRAVENOUS
  Filled 2015-04-02: qty 10

## 2015-04-02 MED ORDER — ONDANSETRON HCL 4 MG/2ML IJ SOLN
4.0000 mg | Freq: Once | INTRAMUSCULAR | Status: AC
  Administered 2015-04-02: 4 mg via INTRAVENOUS
  Filled 2015-04-02: qty 2

## 2015-04-02 MED ORDER — MORPHINE SULFATE (PF) 2 MG/ML IV SOLN
1.0000 mg | INTRAVENOUS | Status: DC | PRN
  Administered 2015-04-02 – 2015-04-03 (×2): 1 mg via INTRAVENOUS
  Filled 2015-04-02 (×2): qty 1

## 2015-04-02 NOTE — H&P (Signed)
Date: 04/02/2015               Patient Name:  Catherine Morales MRN: [id number]  DOB: April 24, 1991 Age / Sex: 24 y.o., female   PCP: No primary care provider on file.         Medical Service: Internal Medicine Teaching Service         Attending Physician: Dr. Earl Lagos, MD    First Contact: Dr. Gwynn Burly Pager: [phone]  Second Contact: Dr. Hyacinth Meeker Pager: [phone]       After Hours (After 5p/  First Contact Pager: [phone]  weekends / holidays): Second Contact Pager: [phone]   Chief Complaint: nausea, vomiting  History of Present Illness: [name] is a 24 y.o.  AA female with past medical history of scoliosis who presents to the emergency department with 1 day history of nausea, vomiting, subjective fever, and muscle aches all over.  States she took some ibuprofen with some relief.  States that she has some dysuria described as pressure when she is urinating.  Otherwise, she denies any recent sick contacts, ate pepperoni pizza last night, and works at [employer].   She denies any chest pain, shortness of breath.  Also, denies any discharge or foul odor.  In the ED, she had a urinalysis with large leukocytes, positive nitrites.  Initial lactic acid 3.6, normal WBC.  Received 2L bolus with normalization of lactate and improvement of symptoms.  She had soft BP and was given another 2L NS and BP improved.  She also tolerated PO medications and ate food without symptoms.  She was to be discharged and then became febrile at 101.8 and tachycardic.  Reportedly had chills and did not feel well so was admitted.  Meds: Current Facility-Administered Medications  Medication Dose Route Frequency Provider Last Rate Last Dose  . 0.9 %  sodium chloride infusion   Intravenous STAT Richardean Canal, MD      . acetaminophen (TYLENOL) tablet 650 mg  650 mg Oral Q6H PRN Hyacinth Meeker, MD      . Melene Muller ON 04/03/2015] cefTRIAXone (ROCEPHIN) 1 g in dextrose 5 % 50 mL IVPB  1 g Intravenous Q24H Hyacinth Meeker, MD        Allergies: Allergies as of 04/02/2015  . (No Known Allergies)   Past Medical History  Diagnosis Date  . Scoliosis   . No pertinent past medical history    History reviewed. No pertinent past surgical history. History reviewed. No pertinent family history. Social History   Social History  . Marital Status: Single    Spouse Name: N/A  . Number of Children: N/A  . Years of Education: N/A   Occupational History  . Not on file.   Social History Main Topics  . Smoking status: Current Every Day Smoker  . Smokeless tobacco: Not on file  . Alcohol Use: No  . Drug Use: No  . Sexual Activity: Yes    Birth Control/ Protection: Implant   Other Topics Concern  . Not on file   Social History Narrative    Review of Systems: Pertinent items are noted in HPI.  Physical Exam: Blood pressure 104/57, pulse 84, temperature 99.9 F (37.7 C), temperature source Other (Comment), resp. rate 14, height  (1.676 m), weight 210 lb 12.8 oz (95.618 kg), last menstrual period 03/26/2015, SpO2 100 %.  Physical Exam  Constitutional: She appears well-developed and well-nourished.  She appears somewhat somnolent from pain medications  HENT:  Head: Normocephalic and atraumatic.  Eyes: EOM are normal.  Neck: Normal range of motion.  Cardiovascular: Normal rate, regular rhythm, normal heart sounds and intact distal pulses.   Pulmonary/Chest: Effort normal and breath sounds normal.  Abdominal: Soft. Bowel sounds are normal. She exhibits no distension. There is no tenderness.  Negative for CVA tenderness  Skin: Skin is warm and dry.  Psychiatric: She has a normal mood and affect. Judgment normal.     Lab results: Basic Metabolic Panel:  Recent Labs  16/10/96 0600  NA 136  K 3.6  CL 104  CO2 23  GLUCOSE 132*  BUN 7  CREATININE 0.85  CALCIUM 9.1   Liver Function Tests:  Recent Labs  04/02/15 0600  AST 25  ALT 14  ALKPHOS 65  BILITOT 0.9  PROT 6.7    ALBUMIN 3.5    Recent Labs  04/02/15 0600  LIPASE 21*   CBC:  Recent Labs  04/02/15 0600  WBC 7.7  HGB 11.9*  HCT 35.7*  MCV 92.2  PLT 271   Urinalysis:  Recent Labs  04/02/15 0848  COLORURINE YELLOW  LABSPEC 1.026  PHURINE 6.0  GLUCOSEU NEGATIVE  HGBUR LARGE*  BILIRUBINUR NEGATIVE  KETONESUR 15*  PROTEINUR 30*  UROBILINOGEN 0.2  NITRITE POSITIVE*  LEUKOCYTESUR LARGE*    Imaging results:  Dg Chest 2 View  04/02/2015   CLINICAL DATA:  Shortness of breath, cough, and fever starting 2 days ago  EXAM: CHEST  2 VIEW  COMPARISON:  None.  FINDINGS: Thoracolumbar scoliosis. Normal heart size and pulmonary vascularity. No focal airspace disease or consolidation in the lungs. No blunting of costophrenic angles. No pneumothorax. Mediastinal contours appear intact.  IMPRESSION: No active cardiopulmonary disease.   Electronically Signed   By: Burman Nieves M.D.   On: 04/02/2015 06:38    Assessment & Plan by Problem: Active Problems:   Pyelonephritis  24 y.o.  AA female with past medical history of scoliosis who presents to the emergency department with 1 day history of nausea, vomiting, subjective fever, and muscle aches all over.  Urinary tract infection: possible pyelonephritis.  Initial suspicion for influenza but probably less likely given urinary complaints.  Lipase normal, urine pregnancy negative.  Still complaining of generalized body aches that are relieved with pain meds -check for influenza -f/up urine cx, blood cx x 2 -trend WBC -monitor for fever -ceftriaxone 1gm IV q24h -acetaminophen 650mg  prn -NS IV fluids @ 158ml/hr x 12 hr  Dispo: Disposition is deferred at this time, awaiting improvement of current medical problems.   The patient does not have a current PCP (No primary care provider on file.) and does need an [facility] hospital follow-up appointment after discharge.  The patient does not have transportation limitations that hinder transportation to  clinic appointments.  Signed: Gwynn Burly, DO 04/02/2015, 6:56 PM

## 2015-04-02 NOTE — ED Notes (Signed)
First blood cultures drawn at 956 prior to antibiotics. Second will be after antibiotic.

## 2015-04-02 NOTE — ED Provider Notes (Addendum)
CSN: [id number]     Arrival date & time 04/02/15  4098 History   First MD Initiated Contact with Patient 04/02/15 0744     Chief Complaint  Patient presents with  . Influenza     (Consider location/radiation/quality/duration/timing/severity/associated sxs/prior Treatment) The history is provided by the patient.  Catherine Morales is a 24 y.o. female hx of scoliosis, here with nausea, vomiting, cough, body aches, fever, back pain. Patient has had back pain and nausea vomiting since yesterday. Also diffuse body aches as well as fever. Took some ibuprofen with some relief but when she tried to sleep she felt worse body aches again. At some mild dysuria as well. Otherwise healthy denies any sick contacts.    Past Medical History  Diagnosis Date  . Scoliosis   . No pertinent past medical history    History reviewed. No pertinent past surgical history. History reviewed. No pertinent family history. Social History  Substance Use Topics  . Smoking status: Current Every Day Smoker  . Smokeless tobacco: None  . Alcohol Use: No   OB History    Gravida Para Term Preterm AB TAB SAB Ectopic Multiple Living   Review of Systems  Gastrointestinal: Positive for vomiting.  Musculoskeletal: Positive for myalgias.  All other systems reviewed and are negative.     Allergies  Review of patient's allergies indicates no known allergies.  Home Medications   Prior to Admission medications   Medication Sig Start Date End Date Taking? Authorizing Provider  acetaminophen (TYLENOL) 500 MG tablet Take 1,000 mg by mouth every 6 (six) hours as needed for fever or headache (pain).   Yes Historical Provider, MD  Etonogestrel (IMPLANON [place]) Inject 1 application into the skin as directed.   Yes Historical Provider, MD  ibuprofen (ADVIL,MOTRIN) 200 MG tablet Take 600 mg by mouth every 6 (six) hours as needed for headache (pain).   Yes Historical Provider, MD   BP 127/79 mmHg  Pulse 60   Temp(Src) 98.4 F (36.9 C) (Oral)  Resp 20  SpO2 100%  LMP 03/26/2015 Physical Exam  Constitutional: She is oriented to person, place, and time.  Uncomfortable, dehydrated   HENT:  Head: Normocephalic.  MM slightly dry   Eyes: Conjunctivae are normal. Pupils are equal, round, and reactive to light.  Neck: Normal range of motion. Neck supple.  Cardiovascular:  Slightly tachy   Pulmonary/Chest: Effort normal and breath sounds normal. No respiratory distress. She has no wheezes. She has no rales.  Abdominal: Soft. Bowel sounds are normal. She exhibits no distension. There is no tenderness. There is no rebound and no guarding.  No CVAT   Musculoskeletal: Normal range of motion. She exhibits no edema or tenderness.  Neurological: She is alert and oriented to person, place, and time.  Skin: Skin is warm and dry.  Psychiatric: She has a normal mood and affect. Her behavior is normal. Judgment and thought content normal.  Nursing note and vitals reviewed.   ED Course  Procedures (including critical care time) Labs Review Labs Reviewed  LIPASE, BLOOD - Abnormal; Notable for the following:    Lipase 21 (*)    All other components within normal limits  COMPREHENSIVE METABOLIC PANEL - Abnormal; Notable for the following:    Glucose, Bld 132 (*)    All other components within normal limits  CBC - Abnormal; Notable for the following:    Hemoglobin 11.9 (*)  HCT 35.7 (*)    All other components within normal limits  URINALYSIS, ROUTINE W REFLEX MICROSCOPIC (NOT AT [facility]) - Abnormal; Notable for the following:    APPearance TURBID (*)    Hgb urine dipstick LARGE (*)    Ketones, ur 15 (*)    Protein, ur 30 (*)    Nitrite POSITIVE (*)    Leukocytes, UA LARGE (*)    All other components within normal limits  URINE MICROSCOPIC-ADD ON - Abnormal; Notable for the following:    Squamous Epithelial / LPF FEW (*)    Bacteria, UA MANY (*)    All other components within normal limits  I-STAT  CG4 LACTIC ACID, ED - Abnormal; Notable for the following:    Lactic Acid, Venous 3.65 (*)    All other components within normal limits  URINE CULTURE  I-STAT BETA HCG BLOOD, ED ([facility], [facility], [facility] ONLY)  I-STAT CG4 LACTIC ACID, ED  I-STAT CG4 LACTIC ACID, ED    Imaging Review Dg Chest 2 View  04/02/2015   CLINICAL DATA:  Shortness of breath, cough, and fever starting 2 days ago  EXAM: CHEST  2 VIEW  COMPARISON:  None.  FINDINGS: Thoracolumbar scoliosis. Normal heart size and pulmonary vascularity. No focal airspace disease or consolidation in the lungs. No blunting of costophrenic angles. No pneumothorax. Mediastinal contours appear intact.  IMPRESSION: No active cardiopulmonary disease.   Electronically Signed   By: Burman Nieves M.D.   On: 04/02/2015 06:38   I have personally reviewed and evaluated these images and lab results as part of my medical decision-making.   EKG Interpretation None      MDM   Final diagnoses:  None   Catherine Morales is a 24 y.o. female here with body aches, vomiting, fever. I think has flu like syndrome. Sepsis orders placed in triage. Will hydrate and reassess.   1:21 PM Initial lactate 3.6. UA + UTI. Given ceftriaxone. WBC nl. After 2L bolus, lactate now nl. Felt better. BP slightly soft around upper 90s. Given another 2L NS and now BP 127/79. Felt better now. Tolerated PO tylenol in the ED. Ate food. Will dc home with keflex.   2:26 PM  Around time of discharge, patient became febrile 101.67F and more tachy. Had chills again and doesn't feel well. Given 4 L NS already. Will admit for possible pyelo.   3:04 PM Discussed with Dr. Tasia Catchings from internal medicine teaching service. Will admit to Dr. Eliane Decree under Med/surg.     Richardean Canal, MD 04/02/15 1323  Richardean Canal, MD 04/02/15 [phone]

## 2015-04-02 NOTE — ED Notes (Signed)
Patient here with complaint of nausea, vomiting, cough, body aches, fever, and back pain. States onset yesterday. Reports taking ibuprofen yesterday and feeling some relief. After laying down for bed tonight she began to feel worse again. Took Tylenol upon return of discomfort and vomited.

## 2015-04-03 ENCOUNTER — Telehealth: Payer: Self-pay | Admitting: Family Medicine

## 2015-04-03 LAB — CBC
HEMATOCRIT: 31.3 % — AB (ref 36.0–46.0)
HEMOGLOBIN: 10 g/dL — AB (ref 12.0–15.0)
MCH: 30 pg (ref 26.0–34.0)
MCHC: 31.9 g/dL (ref 30.0–36.0)
MCV: 94 fL (ref 78.0–100.0)
Platelets: 244 10*3/uL (ref 150–400)
RBC: 3.33 MIL/uL — AB (ref 3.87–5.11)
RDW: 12.9 % (ref 11.5–15.5)
WBC: 14.6 10*3/uL — AB (ref 4.0–10.5)

## 2015-04-03 LAB — BASIC METABOLIC PANEL
ANION GAP: 5 (ref 5–15)
CHLORIDE: 110 mmol/L (ref 101–111)
CO2: 23 mmol/L (ref 22–32)
Calcium: 7.9 mg/dL — ABNORMAL LOW (ref 8.9–10.3)
Creatinine, Ser: 0.58 mg/dL (ref 0.44–1.00)
GFR calc Af Amer: >60 mL/min (ref >60)
GLUCOSE: 108 mg/dL — AB (ref 65–99)
POTASSIUM: 3.4 mmol/L — AB (ref 3.5–5.1)
Sodium: 138 mmol/L (ref 135–145)

## 2015-04-03 LAB — INFLUENZA PANEL BY PCR (TYPE A & B)
H1N1 flu by pcr: NOT DETECTED
Influenza A By PCR: NEGATIVE
Influenza B By PCR: NEGATIVE

## 2015-04-03 LAB — CK: Total CK: 53 U/L (ref 38–234)

## 2015-04-03 MED ORDER — CIPROFLOXACIN HCL 500 MG PO TABS: 500.0000 mg | ORAL_TABLET | Freq: Two times a day (BID) | ORAL | Status: AC

## 2015-04-03 MED ORDER — IBUPROFEN 600 MG PO TABS
600.0000 mg | ORAL_TABLET | Freq: Four times a day (QID) | ORAL | Status: DC | PRN
  Administered 2015-04-03 (×2): 600 mg via ORAL
  Filled 2015-04-03 (×2): qty 1

## 2015-04-03 MED ORDER — INFLUENZA VAC SPLIT QUAD 0.5 ML IM SUSY: 0.5000 mL | PREFILLED_SYRINGE | INTRAMUSCULAR | Status: DC | PRN

## 2015-04-03 MED ORDER — INFLUENZA VAC SPLIT QUAD 0.5 ML IM SUSY: 0.5000 mL | PREFILLED_SYRINGE | INTRAMUSCULAR | Status: DC

## 2015-04-03 NOTE — Discharge Summary (Signed)
Name: Catherine Morales MRN: [id number] DOB: 1991/02/22 24 y.o. PCP: No primary care provider on file.  Date of Admission: 04/02/2015  7:31 AM Date of Discharge: 04/05/2015 Attending Physician: No att. providers found  Discharge Diagnosis: 1. Pyelonephritis  Active Problems:   Pyelonephritis  Discharge Medications:   Medication List    TAKE these medications        acetaminophen 500 MG tablet  Commonly known as:  TYLENOL  Take 1,000 mg by mouth every 6 (six) hours as needed for fever or headache (pain).     ciprofloxacin 500 MG tablet  Commonly known as:  CIPRO  Take 1 tablet (500 mg total) by mouth 2 (two) times daily.     ibuprofen 200 MG tablet  Commonly known as:  ADVIL,MOTRIN  Take 600 mg by mouth every 6 (six) hours as needed for headache (pain).     IMPLANON [place]  Inject 1 application into the skin as directed.        Disposition and follow-up:   Catherine Morales was discharged from [facility] in Stable condition.  At the hospital follow up visit please address:  1.  Whether patient has been able to obtain and been taking her antibiotics.  Ask if she has plans for Ob/Gyn follow up to remove her Nexplanon.  2.  Labs / imaging needed at time of follow-up: CBC, BMP  3.  Pending labs/ test needing follow-up: blood cultures, urine cultures  Follow-up Appointments: Follow-up Information    Follow up with Kennis Carina, MD On 04/05/2015.   Specialty:  Internal Medicine   Why:  :15   Contact information:   [address] [place] [address] [phone]       Discharge Instructions:   Consultations:    Procedures Performed:  Dg Chest 2 View  04/02/2015   CLINICAL DATA:  Shortness of breath, cough, and fever starting 2 days ago  EXAM: CHEST  2 VIEW  COMPARISON:  None.  FINDINGS: Thoracolumbar scoliosis. Normal heart size and pulmonary vascularity. No focal airspace disease or consolidation in the lungs. No blunting of costophrenic  angles. No pneumothorax. Mediastinal contours appear intact.  IMPRESSION: No active cardiopulmonary disease.   Electronically Signed   By: Burman Nieves M.D.   On: 04/02/2015 06:38    2D Echo: none  Cardiac Cath: none  Admission HPI: Catherine Morales is a 24 y.o. AA female with past medical history of scoliosis who presents to the emergency department with 1 day history of nausea, vomiting, subjective fever, and muscle aches all over. States she took some ibuprofen with some relief. States that she has some dysuria described as pressure when she is urinating. Otherwise, she denies any recent sick contacts, ate pepperoni pizza last night, and works at [employer]. She denies any chest pain, shortness of breath. Also, denies any discharge or foul odor.  In the ED, she had a urinalysis with large leukocytes, positive nitrites. Initial lactic acid 3.6, normal WBC. Received 2L bolus with normalization of lactate and improvement of symptoms. She had soft BP and was given another 2L NS and BP improved. She also tolerated PO medications and ate food without symptoms. She was to be discharged and then became febrile at 101.8 and tachycardic. Reportedly had chills and did not feel well so was admitted.  Hospital Course by problem list: Active Problems:   Pyelonephritis   1. Pyelonephritis: initially, in the ED she was suspected to have influenza.  However,  her UA had large amount of leukocytes and was nitrite positive.  With the addition of her other symptoms of nausea and vomiting and abdominal pain, pyelonephritis was considered more likely and she was started on ceftriaxone 1gm IV every 24 hours.  She was also given tylenol for fever and morphine in the ED for pain.  Overnight, she was given more morphine for pain control.  The following day the morphine was discontinued and she was started on ibuprofen for pain control.  Her WBC at admission was 7.7 and increased the following day to 14.6.  Her  urine culture grew > 100,000 colonies/mL of E. Coli, sensitivities pending at discharge.  Her blood cultures had been NGTD.  She was discharged with a prescription for ciprofloxacin 500mg  twice daily to be taken for 5 more days.  Sensitivities returned after discharge show that the E. Coli in her urine culture is sensitive to ciprofloxacin.  Discharge Vitals:   BP 105/66 mmHg  Pulse 58  Temp(Src) 98 F (36.7 C) (Oral)  Resp 17  Ht 5\' 6"  (1.676 m)  Wt 210 lb 12.8 oz (95.618 kg)  BMI 34.04 kg/m2  SpO2 100%  LMP 03/26/2015  Discharge Labs:  No results found for this or any previous visit (from the past 24 hour(s)).  Signed: Gwynn Burly, DO 04/05/2015, 5:17 AM    Services Ordered on Discharge: none Equipment Ordered on Discharge: none

## 2015-04-03 NOTE — Discharge Instructions (Signed)
Take Ciprofloxacin twice a day for 5 more days.   Take tylenol and motrin for myalgias.   Stay hydrated.   See your doctor.   Return to ER if you have fever for a week, unable to urinate, severe abdominal pain, vomiting, worse myalgias.

## 2015-04-03 NOTE — Progress Notes (Signed)
Subjective: Patient improved today without any nausea or vomiting.  States still has some muscle aches.  When asked to specify where, she states all over.  She endorses some low back pain in the lumbar region on her right side when lying on it she states.  No other new complaints.  Objective: Vital signs in last 24 hours: Filed Vitals:   04/02/15 1630 04/02/15 1713 04/02/15 2137 04/03/15 0558  BP: 106/49 104/57 101/61 112/62  Pulse: 85 84 82 68  Temp:  99.9 F (37.7 C) 98.7 F (37.1 C) 99.4 F (37.4 C)  TempSrc:  Other (Comment) Oral   Resp: Height:   (1.676 m)    Weight:  210 lb 12.8 oz (95.618 kg)    SpO2: 99% 100% 98% 100%   Weight change:   Intake/Output Summary (Last 24 hours) at 04/03/15 1501 Last data filed at 04/03/15 1036  Gross per 24 hour  Intake 5052.5 ml  Output   1900 ml  Net 3152.5 ml   General: resting in bed HEENT:  EOMI, no scleral icterus Cardiac: RRR, no rubs, murmurs or gallops Pulm: clear to auscultation bilaterally, moving normal volumes of air Abd: soft, nontender, nondistended, BS present Ext: warm and well perfused, no pedal edema GU: no CVA tenderness MSK: mild tenderness in her lumbar spine on right side.  Neuro: alert and oriented X3, cranial nerves II-XII grossly intact  Lab Results: Basic Metabolic Panel:  Recent Labs Lab 04/02/15 0600 04/03/15 0345  NA 136 138  K 3.6 3.4*  CL 104 110  CO2 23 23  GLUCOSE 132* 108*  BUN 7 <5*  CREATININE 0.85 0.58  CALCIUM 9.1 7.9*   Liver Function Tests:  Recent Labs Lab 04/02/15 0600  AST 25  ALT 14  ALKPHOS 65  BILITOT 0.9  PROT 6.7  ALBUMIN 3.5    Recent Labs Lab 04/02/15 0600  LIPASE 21*   No results for input(s): AMMONIA in the last 168 hours. CBC:  Recent Labs Lab 04/02/15 0600 04/03/15 0345  WBC 7.7 14.6*  HGB 11.9* 10.0*  HCT 35.7* 31.3*  MCV 92.2 94.0  PLT 271 244   Cardiac Enzymes:  Recent Labs Lab 04/03/15 0345  CKTOTAL 53    Urinalysis:  Recent Labs Lab 04/02/15 0848  COLORURINE YELLOW  LABSPEC 1.026  PHURINE 6.0  GLUCOSEU NEGATIVE  HGBUR LARGE*  BILIRUBINUR NEGATIVE  KETONESUR 15*  PROTEINUR 30*  UROBILINOGEN 0.2  NITRITE POSITIVE*  LEUKOCYTESUR LARGE*     Micro Results: Recent Results (from the past 240 hour(s))  Urine culture     Status: None (Preliminary result)   Collection Time: 04/02/15  8:48 AM  Result Value Ref Range Status   Specimen Description URINE, CLEAN CATCH  Final   Special Requests NONE  Final   Culture >=100,000 COLONIES/mL ESCHERICHIA COLI  Final   Report Status PENDING  Incomplete  Blood culture (routine x 2)     Status: None (Preliminary result)   Collection Time: 04/02/15  9:56 AM  Result Value Ref Range Status   Specimen Description BLOOD RIGHT ARM  Final   Special Requests BOTTLES DRAWN AEROBIC ONLY 10CC  Final   Culture NO GROWTH < 24 HOURS  Final   Report Status PENDING  Incomplete  Blood culture (routine x 2)     Status: None (Preliminary result)   Collection Time: 04/02/15  3:53 PM  Result Value Ref Range Status   Specimen Description BLOOD RIGHT ANTECUBITAL  Final   Special Requests BOTTLES DRAWN AEROBIC AND ANAEROBIC 5CC  Final   Culture NO GROWTH < 24 HOURS  Final   Report Status PENDING  Incomplete   Studies/Results: Dg Chest 2 View  04/02/2015   CLINICAL DATA:  Shortness of breath, cough, and fever starting 2 days ago  EXAM: CHEST  2 VIEW  COMPARISON:  None.  FINDINGS: Thoracolumbar scoliosis. Normal heart size and pulmonary vascularity. No focal airspace disease or consolidation in the lungs. No blunting of costophrenic angles. No pneumothorax. Mediastinal contours appear intact.  IMPRESSION: No active cardiopulmonary disease.   Electronically Signed   By: Burman Nieves M.D.   On: 04/02/2015 06:38   Medications:  Scheduled Meds: . cefTRIAXone (ROCEPHIN)  IV  1 g Intravenous Q24H   Continuous Infusions:  PRN Meds:.acetaminophen, ibuprofen,  Influenza vac split quadrivalent PF Assessment/Plan: Active Problems:   Pyelonephritis  24 y.o. AA female with past medical history of scoliosis who presents to the emergency department with 1 day history of nausea, vomiting, subjective fever, and muscle aches all over.  Urinary tract infection: suspicious for pyelonephritis. Initial suspicion for influenza but probably less likely given urinary complaints. -influenza negative -urine cx growing > 100,000 colonies/mL E. Coli -f/up blood cx -WBC 7.7-->14.6 -afebrile overnight -ceftriaxone 1gm IV q24h --> ciprofloxacin 500mg  bid x 5 more days -acetaminophen 650mg  prn -ibuprofen 600mg  q6h prn   Dispo: Diischarge to home today with 5 more days of cipro 500mg  bid, ibuprofen for pain control.  The patient does not have a current PCP (No primary care provider on file.) and does need an [facility] hospital follow-up appointment after discharge.  The patient does not have transportation limitations that hinder transportation to clinic appointments.     Gwynn Burly, DO 04/03/2015, 3:01 PM

## 2015-04-03 NOTE — Telephone Encounter (Signed)
Pt wants Dr. Shawnie Pons to place order for Nexplanon to be removed while pt is admitted to hospital. Pt's mother, Babette Relic can be reached at [phone]. Please advise.

## 2015-04-04 ENCOUNTER — Encounter: Payer: Medicaid Other | Admitting: Family Medicine

## 2015-04-04 ENCOUNTER — Telehealth: Payer: Self-pay | Admitting: *Deleted

## 2015-04-04 LAB — URINE CULTURE: Culture: >=100000

## 2015-04-04 NOTE — Telephone Encounter (Signed)
Transition Care Management Follow-up Telephone Call   Date discharged 04/03/15  How have you been since you were released from the hospital? ok, still having mild abd pain.   Do you understand why you were in the hospital?  yes   Do you understand the discharge instructions?  yes   Where were you discharged to? home   Items Reviewed:  Medications reviewed: yes, talking Cipro and IBU  Allergies reviewed: No allergies  Dietary changes reviewed: No changes  Referrals reviewed  No referrals.   Functional Questionnaire:   Activities of Daily Living (ADLs):   She states they are independent in the following: ambulation, bathing and hygiene, feeding, continence, grooming, toileting and dressing  independent States they require assistance with the following: none   Any transportation issues/concerns?:  No issues  Any patient concerns? No  Confirmed importance and date/time of follow-up visits scheduled she is aware, OV 9/14 @ 8:15  Provider Appointment booked with Dr. Mariea Clonts  Confirmed with patient if condition begins to worsen call PCP or go to the ER.  Patient was given the office number and encouraged to call back with question or concerns.  : Appointment is tomorrow

## 2015-04-05 ENCOUNTER — Ambulatory Visit (INDEPENDENT_AMBULATORY_CARE_PROVIDER_SITE_OTHER): Payer: Medicaid Other | Admitting: Internal Medicine

## 2015-04-05 ENCOUNTER — Encounter: Payer: Self-pay | Admitting: Internal Medicine

## 2015-04-05 VITALS — BP 120/67 | HR 69 | Temp 98.0°F | Ht 66.0 in | Wt 206.7 lb

## 2015-04-05 DIAGNOSIS — N10 Acute tubulo-interstitial nephritis: Secondary | ICD-10-CM

## 2015-04-05 DIAGNOSIS — B9620 Unspecified Escherichia coli [E. coli] as the cause of diseases classified elsewhere: Secondary | ICD-10-CM

## 2015-04-05 DIAGNOSIS — N12 Tubulo-interstitial nephritis, not specified as acute or chronic: Secondary | ICD-10-CM

## 2015-04-05 DIAGNOSIS — Z0000 Encounter for general adult medical examination without abnormal findings: Secondary | ICD-10-CM | POA: Insufficient documentation

## 2015-04-05 NOTE — Progress Notes (Signed)
Patient ID: Catherine Morales, female   DOB: 1991/04/09, 24 y.o.   MRN: [id number]   Subjective:   Patient ID: Catherine Morales female   DOB: 1990-12-07 24 y.o.   MRN: [id number]  HPI: [name] is a 24 y.o. with no significant PM, presented to the clinic today for Hospital follow up visit and to establish care here- She was admitted to the hospital- 9/11 to 9/12, and was managed for Pyelonephritis. She was sent home to complete 5 more days of Ciprofloxacin. Pt today is complaining of having fevers, subjective, she felt like her body was hiot with chills. She is complaining of back pain today, which she also had in the hospital. She had had symptoms for about  A week and a half before voming to the hospital. Last UTI was in 2010. She is also complaining of body aches, persistent since admission. Some Relief with OTC advil, she took 2 tabs at a time.  She laso edorses headaches- generalized, no changes in vison or neck pain. No diarrhea, vomiting or abdominal pain,, no cough, SOB, no chest pain. No sick contacts. Had teh flu shot in the hospitals.  She has been taking her ciprofloxacin 1ce a day rather than 2ce a day.   Past Medical History  Diagnosis Date  . Scoliosis   . No pertinent past medical history    Current Outpatient Prescriptions  Medication Sig Dispense Refill  . acetaminophen (TYLENOL) 500 MG tablet Take 1,000 mg by mouth every 6 (six) hours as needed for fever or headache (pain).    . ciprofloxacin (CIPRO) 500 MG tablet Take 1 tablet (500 mg total) by mouth 2 (two) times daily. 5 tablet 0  . Etonogestrel (IMPLANON [place]) Inject 1 application into the skin as directed.    Marland Kitchen ibuprofen (ADVIL,MOTRIN) 200 MG tablet Take 600 mg by mouth every 6 (six) hours as needed for headache (pain).     No current facility-administered medications for this visit.   No family history on file. Social History   Social History  . Marital Status: Single    Spouse Name: N/A  . Number of Children:  N/A  . Years of Education: N/A   Social History Main Topics  . Smoking status: Current Every Day Smoker  . Smokeless tobacco: None     Comment: 1 CIGARETTES A DAY  . Alcohol Use: No  . Drug Use: No  . Sexual Activity: Yes    Birth Control/ Protection: Implant   Other Topics Concern  . None   Social History Narrative   Review of Systems: Per HPI.  Objective:  Physical Exam: Filed Vitals:   04/05/15 0827  BP: 120/67  Pulse: 69  Temp: 98 F (36.7 C)  TempSrc: Oral  Height:  (1.676 m)  Weight: 206 lb 11.2 oz (93.759 kg)  SpO2: 100%   GENERAL- alert, co-operative, appears as stated age, not in any distress. HEENT- Atraumatic, normocephalic, PERRL CARDIAC- RRR, no murmurs, rubs or gallops. RESP- Moving equal volumes of air, and clear to auscultation bilaterally, no wheezes or crackles. ABDOMEN- Soft, nontender, bowel sounds present. BACK- Normal curvature of the spine, No tenderness along the vertebrae, no CVA tenderness. NEURO- Alert and oriented,  Gait- Normal. EXTREMITIES- pulse 2+, symmetric, no pedal edema. SKIN- Warm, dry, No rash or lesion. PSYCH- Normal mood and affect, appropriate thought content and speech.  Assessment & Plan:   The patient's case and plan of care was discussed with attending physician, Dr. Dalphine Handing.  Please see problem based charting for assessment and plan.

## 2015-04-05 NOTE — Assessment & Plan Note (Addendum)
Pt Urine cultures- >100,000 colonies of Ecoli, sensitive to Cipro. Pt has been taking Cipro once a day rather than two times a day. She has to complete 4 more days of medication. She feels better but she thinks she is still having fever and body aches. No signs of systemic infection at at his time and exam is essentaily normal with CVA tenderness. She uses implanon for contraception.  Plan- Pt to take Cipro 2 times a day, complete course - Return to clinic in 1 week. - Ibuprofen  BID for body aches. - Encouraged hydration.

## 2015-04-05 NOTE — Assessment & Plan Note (Signed)
Last PAP Smear - 2014 normal per patient. Remind pt to bring paperwork next visit. She gets her PAP smears done by OB/Gyn in [place] by Dr Tinnie Gens.

## 2015-04-05 NOTE — Patient Instructions (Signed)
We will see you in 1 week.  If you feel worse, come back to the clinic.   You can take up to three tablets of the Over the counter Ibuprofen when you feel body aches. Try not to take it more than 2 times a day. If you are still having fevers by Sunday, we should see you in clinic on monday. Also try to take you temperature so we can be sure you are having a fever.  Please take your antibiotic two times a day.

## 2015-04-05 NOTE — Progress Notes (Signed)
Internal Medicine Clinic Attending  Case discussed with Dr. Emokpae at the time of the visit.  We reviewed the resident's history and exam and pertinent patient test results.  I agree with the assessment, diagnosis, and plan of care documented in the resident's note.  

## 2015-04-07 LAB — CULTURE, BLOOD (ROUTINE X 2)
CULTURE: NO GROWTH
Culture: NO GROWTH

## 2015-04-13 ENCOUNTER — Ambulatory Visit: Payer: Medicaid Other | Admitting: Internal Medicine

## 2015-04-19 ENCOUNTER — Ambulatory Visit: Payer: Medicaid Other | Admitting: Internal Medicine

## 2015-04-21 ENCOUNTER — Ambulatory Visit (INDEPENDENT_AMBULATORY_CARE_PROVIDER_SITE_OTHER): Payer: Medicaid Other | Admitting: Internal Medicine

## 2015-04-21 ENCOUNTER — Encounter: Payer: Self-pay | Admitting: Internal Medicine

## 2015-04-21 VITALS — BP 111/72 | HR 62 | Temp 97.8°F | Ht 66.0 in | Wt 198.8 lb

## 2015-04-21 DIAGNOSIS — M545 Low back pain, unspecified: Secondary | ICD-10-CM

## 2015-04-21 DIAGNOSIS — M4184 Other forms of scoliosis, thoracic region: Secondary | ICD-10-CM | POA: Diagnosis not present

## 2015-04-21 DIAGNOSIS — G8929 Other chronic pain: Secondary | ICD-10-CM | POA: Diagnosis not present

## 2015-04-21 DIAGNOSIS — N12 Tubulo-interstitial nephritis, not specified as acute or chronic: Secondary | ICD-10-CM

## 2015-04-21 HISTORY — DX: Low back pain, unspecified: M54.50

## 2015-04-21 HISTORY — DX: Other chronic pain: G89.29

## 2015-04-21 MED ORDER — CYCLOBENZAPRINE HCL 10 MG PO TABS: 10.0000 mg | ORAL_TABLET | Freq: Three times a day (TID) | ORAL | Status: DC | PRN

## 2015-04-21 MED ORDER — NAPROXEN SODIUM 220 MG PO CAPS: 220.0000 mg | ORAL_CAPSULE | Freq: Two times a day (BID) | ORAL | Status: DC

## 2015-04-21 MED ORDER — NAPROXEN SODIUM 220 MG PO CAPS
220.0000 mg | ORAL_CAPSULE | Freq: Two times a day (BID) | ORAL | Status: DC
Start: 2015-04-21 — End: 2015-04-21

## 2015-04-21 NOTE — Assessment & Plan Note (Signed)
Pt says she has always had chronic low back pain for years, was diagnosed with scoliosis- 4 yrs ago. PAin is low back not midline and is present lateral bila to midline spine. She has good rang eof motion. She has tried OTC ibuprofen without relief. Doubt Ankylosing spondylitis, a differential considering her age- She says pain is worse with acivity, stiffness in the morning last 10-15 mins, mother has back pain though- she does not know why. She has no radicular or foacl deficits. Pain is also increased with palpation and located lateral to spine bilat, no midline.  -Plan- No need for imagiong at this time - Trial of naproxen instead of Ibuprofen - Flexeril-  TID, #30 tbs.

## 2015-04-21 NOTE — Progress Notes (Signed)
Medicine attending: Medical history, presenting problems, physical findings, and medications, reviewed with Dr Emokpae and I concur with her evaluation and management plan. 

## 2015-04-21 NOTE — Progress Notes (Signed)
Patient ID: Catherine Morales, female   DOB: 01-Mar-1991, 24 y.o.   MRN: [id number]   Subjective:   Patient ID: Catherine Morales female   DOB: [birth year] 24 y.o.   MRN: [id number]  HPI: [name] is a 24 y.o. with no significant PMH. Presented today for follow up post hospital discharge for Pyelonephritis. With complaints of low back pain today, please see assessment and plan for details on complaint.  Past Medical History  Diagnosis Date  . Scoliosis   . No pertinent past medical history    Current Outpatient Prescriptions  Medication Sig Dispense Refill  . acetaminophen (TYLENOL) 500 MG tablet Take 1,000 mg by mouth every 6 (six) hours as needed for fever or headache (pain).    . Etonogestrel (IMPLANON [place]) Inject 1 application into the skin as directed.    Marland Kitchen ibuprofen (ADVIL,MOTRIN) 200 MG tablet Take 600 mg by mouth every 6 (six) hours as needed for headache (pain).     No current facility-administered medications for this visit.   No family history on file. Social History   Social History  . Marital Status: Single    Spouse Name: N/A  . Number of Children: N/A  . Years of Education: N/A   Social History Main Topics  . Smoking status: Current Every Day Smoker -- 0.60 packs/day    Types: Cigarettes  . Smokeless tobacco: None     Comment: 1 CIGARETTES A DAY  . Alcohol Use: No  . Drug Use: No  . Sexual Activity: Yes    Birth Control/ Protection: Implant   Other Topics Concern  . None   Social History Narrative   Review of Systems: CONSTITUTIONAL- No Fever, weightloss, night sweat or change in appetite. SKIN- No Rash, colour changes or itching. HEAD- No Headache or dizziness. Mouth/throat- No Sorethroat, dentures, or bleeding gums. RESPIRATORY- No Cough or SOB. CARDIAC- No Palpitations, or chest pain. GI- No vomiting, diarrhoea,  abd pain. URINARY- No Frequency, urgency, straining or dysuria. NEUROLOGIC- No Numbness, syncope, seizures or burning. [facility]- Denies  depression or anxiety.  Objective:  Physical Exam: Filed Vitals:   04/21/15 0949  BP: 111/72  Pulse: 62  Temp: 97.8 F (36.6 C)  TempSrc: Oral  Height:  (1.676 m)  Weight: 198 lb 12.8 oz (90.175 kg)  SpO2: 100%   GENERAL- alert, co-operative, appears as stated age, not in any distress. HEENT- Atraumatic, normocephalic, PERRL, EOMI, oral mucosa appears moist, neck supple. CARDIAC- RRR, no murmurs, rubs or gallops. RESP- Moving equal volumes of air, and  no wheezes or crackles. ABDOMEN- Soft, nontender, bowel sounds present. BACK- She has obvious scoliosis affecting the right side of her thorax, obvious when she bends over to touch her toes,  Normal curvature of the spine, mild tenderness along the paravertebral muscles- lumber region bilat, normal sensation and strenght. Schoeber test neg. NEURO- No obvious Cr N abnormality, strenght upper and lower extremities- 5/5, Gait- Normal. EXTREMITIES- pulse 2+, symmetric, no pedal edema. SKIN- Warm, dry, No rash or lesion. PSYCH- Normal mood and affect, appropriate thought content and speech.  Assessment & Plan:  The patient's case and plan of care was discussed with attending physician, Dr. Cyndie Chime.  Please see problem based charting for assessment and plan.

## 2015-04-21 NOTE — Patient Instructions (Signed)
I will be prescribing a medication for you called flexeril. Take one tablet three times a day as needed for back pain.   We will see you in three to four months. We will like you to sign a release of information form so we can get the results of your PAP smear.  We may also do some xrays of your back on your next visit.  Try Aleve instead of ibuprofen for pain, you can try two tablets two times ad ay, take the higher dose when the pain is especially bad, otherwise take just one tablet. Take it will meals only.

## 2015-04-21 NOTE — Assessment & Plan Note (Signed)
Completed course of cipro. Feels much back to baseline. Back pain might have been chronic back pain, as she still has them now, and has had it for years.Marland Kitchen

## 2015-04-28 ENCOUNTER — Encounter: Payer: Self-pay | Admitting: Family Medicine

## 2015-04-28 ENCOUNTER — Ambulatory Visit (INDEPENDENT_AMBULATORY_CARE_PROVIDER_SITE_OTHER): Payer: Medicaid Other | Admitting: Family Medicine

## 2015-04-28 VITALS — BP 130/79 | HR 76 | Resp 18 | Ht 66.0 in | Wt 197.0 lb

## 2015-04-28 DIAGNOSIS — Z3046 Encounter for surveillance of implantable subdermal contraceptive: Secondary | ICD-10-CM | POA: Diagnosis not present

## 2015-04-28 DIAGNOSIS — Z30011 Encounter for initial prescription of contraceptive pills: Secondary | ICD-10-CM | POA: Diagnosis not present

## 2015-04-28 MED ORDER — NORGESTIMATE-ETH ESTRADIOL 0.25-35 MG-MCG PO TABS: 1.0000 | ORAL_TABLET | Freq: Every day | ORAL | Status: DC

## 2015-04-28 NOTE — Progress Notes (Signed)
   Subjective:    Patient ID: Catherine Morales is a 24 y.o. female presenting with Nexplanon removal  on 04/28/2015  HPI: Nexplanon in x 6 years, since delivery of child.  Having regular cycles.  She is not wanting children at this time. She desires OC's for birth control.  Review of Systems  Constitutional: Negative for fever and chills.  Respiratory: Negative for shortness of breath.   Cardiovascular: Negative for chest pain.  Gastrointestinal: Negative for nausea, vomiting and abdominal pain.  Genitourinary: Negative for dysuria.  Skin: Negative for rash.      Objective:    BP 130/79 mmHg  Pulse 76  Resp 18  Ht  (1.676 m)  Wt 197 lb (89.359 kg)  BMI 31.81 kg/m2  LMP 04/19/2015 Physical Exam  Constitutional: She is oriented to person, place, and time. She appears well-developed and well-nourished. No distress.  HENT:  Head: Normocephalic and atraumatic.  Eyes: No scleral icterus.  Neck: Neck supple.  Cardiovascular: Normal rate.   Pulmonary/Chest: Effort normal.  Abdominal: Soft.  Neurological: She is alert and oriented to person, place, and time.  Skin: Skin is warm and dry.  Psychiatric: She has a normal mood and affect.   Procedure: Patient given informed consent for removal of her Nexplanon, time out was performed.  Signed copy in the chart.  Appropriate time out taken. Nexplanon site identified.  Area prepped in usual sterile fashon. Three cc of 1% lidocaine was used to anesthetize the area at the distal end of the implant. A small stab incision was made right beside the implant on the distal portion.  The implanon rod was grasped using hemostats and removed without difficulty.  There was less than 3 cc blood loss. There were no complications.  A pressure bandage was applied to reduce any bruising.  The patient tolerated the procedure well and was given post procedure instructions.       Assessment & Plan:   Problem List Items Addressed This Visit    None    Visit Diagnoses    Encounter for initial prescription of contraceptive pills    -  Primary    Relevant Medications    norgestimate-ethinyl estradiol (ORTHO-CYCLEN,SPRINTEC,PREVIFEM) 0.25-35 MG-MCG tablet    Encounter for Nexplanon removal           Return if symptoms worsen or fail to improve.  Adrain Nesbit S 04/28/2015 4:43 PM

## 2015-04-28 NOTE — Patient Instructions (Addendum)

## 2015-05-03 ENCOUNTER — Encounter: Payer: Self-pay | Admitting: *Deleted

## 2015-07-27 ENCOUNTER — Encounter: Payer: Medicaid Other | Admitting: Internal Medicine

## 2015-07-27 ENCOUNTER — Encounter: Payer: Self-pay | Admitting: Internal Medicine

## 2015-08-29 ENCOUNTER — Encounter (HOSPITAL_COMMUNITY): Payer: Self-pay | Admitting: *Deleted

## 2015-08-29 ENCOUNTER — Inpatient Hospital Stay (HOSPITAL_COMMUNITY)
Admission: AD | Admit: 2015-08-29 | Discharge: 2015-08-29 | Disposition: A | Discharge status: Home / Self Care | Payer: Medicaid Other | Source: Ambulatory Visit | Attending: Family Medicine | Admitting: Family Medicine

## 2015-08-29 DIAGNOSIS — N898 Other specified noninflammatory disorders of vagina: Secondary | ICD-10-CM | POA: Insufficient documentation

## 2015-08-29 DIAGNOSIS — M419 Scoliosis, unspecified: Secondary | ICD-10-CM | POA: Diagnosis not present

## 2015-08-29 DIAGNOSIS — R8271 Bacteriuria: Secondary | ICD-10-CM | POA: Diagnosis not present

## 2015-08-29 DIAGNOSIS — N189 Chronic kidney disease, unspecified: Secondary | ICD-10-CM | POA: Insufficient documentation

## 2015-08-29 DIAGNOSIS — B373 Candidiasis of vulva and vagina: Secondary | ICD-10-CM

## 2015-08-29 DIAGNOSIS — B3731 Acute candidiasis of vulva and vagina: Secondary | ICD-10-CM

## 2015-08-29 DIAGNOSIS — R319 Hematuria, unspecified: Secondary | ICD-10-CM | POA: Insufficient documentation

## 2015-08-29 DIAGNOSIS — M545 Low back pain: Secondary | ICD-10-CM | POA: Insufficient documentation

## 2015-08-29 DIAGNOSIS — F17210 Nicotine dependence, cigarettes, uncomplicated: Secondary | ICD-10-CM | POA: Diagnosis not present

## 2015-08-29 HISTORY — DX: Chlamydial infection, unspecified: A74.9

## 2015-08-29 HISTORY — DX: Chronic kidney disease, unspecified: N18.9

## 2015-08-29 LAB — URINE MICROSCOPIC-ADD ON

## 2015-08-29 LAB — URINALYSIS, ROUTINE W REFLEX MICROSCOPIC
Bilirubin Urine: NEGATIVE
Glucose, UA: NEGATIVE mg/dL
Hgb urine dipstick: NEGATIVE
KETONES UR: NEGATIVE mg/dL
NITRITE: NEGATIVE
PH: 7.5 (ref 5.0–8.0)
PROTEIN: NEGATIVE mg/dL
Specific Gravity, Urine: 1.02 (ref 1.005–1.030)

## 2015-08-29 LAB — WET PREP, GENITAL
CLUE CELLS WET PREP: NONE SEEN
Sperm: NONE SEEN
TRICH WET PREP: NONE SEEN

## 2015-08-29 MED ORDER — TERCONAZOLE 0.4 % VA CREA: 1.0000 | TOPICAL_CREAM | Freq: Every day | VAGINAL | Status: DC

## 2015-08-29 MED ORDER — FLUCONAZOLE 150 MG PO TABS: 150.0000 mg | ORAL_TABLET | Freq: Once | ORAL | Status: DC

## 2015-08-29 MED ORDER — SULFAMETHOXAZOLE-TRIMETHOPRIM 800-160 MG PO TABS: 1.0000 | ORAL_TABLET | Freq: Two times a day (BID) | ORAL | Status: DC

## 2015-08-29 NOTE — MAU Provider Note (Signed)
Chief Complaint:  Hematuria   First Provider Initiated Contact with Patient 08/29/15 2034     HPI  Catherine Morales is a 25 y.o. G1P1001 who presents to maternity admissions reporting burning with urination, urinary frequency, low back pain, and concern for STDs ("I checked his phone").  Denies abnormal menses. Denies fever. She reports no vaginal bleeding, does have vaginal itching/burning, urinary symptoms, but denies h/a, dizziness, n/v, or fever/chills.    RN Note: Pt states she has been experiencing pain with urination, bleeding in urine, and discomfort in back for 2-3 days now. Pt states she also needs to be checked for STD's. Pt states she has been having some itching in her vagina.           Past Medical History: Past Medical History  Diagnosis Date  . Scoliosis   . No pertinent past medical history   . Chronic kidney disease   . Chlamydia     Past obstetric history: OB History  Gravida Para Term Preterm AB SAB TAB Ectopic Multiple Living  # Outcome Date GA Lbr Len/2nd Weight Sex Delivery Anes PTL Lv  1 Term               Past Surgical History: History reviewed. No pertinent past surgical history.  Family History: History reviewed. No pertinent family history.  Social History: Social History  Substance Use Topics  . Smoking status: Current Every Day Smoker -- 0.25 packs/day    Types: Cigarettes  . Smokeless tobacco: None     Comment: 1 CIGARETTES A DAY  . Alcohol Use: No    Allergies: No Known Allergies  Meds:  Prescriptions prior to admission  Medication Sig Dispense Refill Last Dose  . acetaminophen (TYLENOL) 500 MG tablet Take 1,000 mg by mouth every 6 (six) hours as needed for fever or headache (pain).   Taking  . cyclobenzaprine (FLEXERIL) 10 MG tablet Take 1 tablet (10 mg total) by mouth 3 (three) times daily as needed for muscle spasms. 30 tablet 0 Taking  . Naproxen Sodium (ALEVE) 220 MG CAPS Take 1 capsule (220 mg total) by  mouth 2 (two) times daily with a meal. 30 each 0 Taking  . norgestimate-ethinyl estradiol (ORTHO-CYCLEN,SPRINTEC,PREVIFEM) 0.25-35 MG-MCG tablet Take 1 tablet by mouth daily. 1 Package 11     I have reviewed patient's Past Medical Hx, Surgical Hx, Family Hx, Social Hx, medications and allergies.  ROS:  Review of Systems  Constitutional: Negative for fever and chills.  Respiratory: Negative for shortness of breath.   Gastrointestinal: Negative for nausea, vomiting, abdominal pain, diarrhea and constipation.  Genitourinary: Positive for dysuria, vaginal discharge, vaginal pain (burning) and pelvic pain. Negative for urgency, flank pain, decreased urine volume and vaginal bleeding.  Musculoskeletal: Positive for back pain.   Other systems negative     Physical Exam  Patient Vitals for the past 24 hrs:  BP Temp Temp src Pulse Resp SpO2  08/29/15 2017 114/66 mmHg 98.2 F (36.8 C) Oral 61 18 100 %   Constitutional: Well-developed, well-nourished female in no acute distress.  Cardiovascular: normal rate and rhythm, no ectopy audible, S1 & S2 heard, no murmur Respiratory: normal effort, no distress. Lungs CTAB with no wheezes or crackles GI: Abd soft, non-tender.  Nondistended.  No rebound, No guarding.  Bowel Sounds audible  MS: Extremities nontender, no edema, normal ROM Neurologic: Alert and oriented x 4.   Grossly nonfocal. GU:  Neg CVAT. Skin:  Warm and Dry Psych:  Affect appropriate.  PELVIC EXAM: Cervix pink, visually closed, without lesion, copious white cottage cheese discharge, vaginal walls and external genitalia normal except for erethema Bimanual exam: Cervix firm, anterior, neg CMT, uterus mildly tender, nonenlarged, adnexa without tenderness, enlargement, or mass    Labs: Results for orders placed or performed during the hospital encounter of 08/29/15 (from the past 24 hour(s))  Urinalysis, Routine w reflex microscopic (not at [facility])     Status: Abnormal   Collection  Time: 08/29/15  8:14 PM  Result Value Ref Range   Color, Urine YELLOW YELLOW   APPearance CLEAR CLEAR   Specific Gravity, Urine 1.020 1.005 - 1.030   pH 7.5 5.0 - 8.0   Glucose, UA NEGATIVE NEGATIVE mg/dL   Hgb urine dipstick NEGATIVE NEGATIVE   Bilirubin Urine NEGATIVE NEGATIVE   Ketones, ur NEGATIVE NEGATIVE mg/dL   Protein, ur NEGATIVE NEGATIVE mg/dL   Nitrite NEGATIVE NEGATIVE   Leukocytes, UA SMALL (A) NEGATIVE  Urine microscopic-add on     Status: Abnormal   Collection Time: 08/29/15  8:14 PM  Result Value Ref Range   Squamous Epithelial / LPF 0-5 (A) NONE SEEN   WBC, UA 6-30 0 - 5 WBC/hpf   RBC / HPF 0-5 0 - 5 RBC/hpf   Bacteria, UA MANY (A) NONE SEEN  Wet prep, genital     Status: Abnormal   Collection Time: 08/29/15  8:45 PM  Result Value Ref Range   Yeast Wet Prep HPF POC PRESENT (A) NONE SEEN   Trich, Wet Prep NONE SEEN NONE SEEN   Clue Cells Wet Prep HPF POC NONE SEEN NONE SEEN   WBC, Wet Prep HPF POC MANY (A) NONE SEEN   Sperm NONE SEEN      UA reported as negative by Tech.  Imaging:  No results found.  MAU Course/MDM: I have ordered labs as follows: Cultures, UA, Urine culture, HIV, RPR Imaging ordered: none Results reviewed.   Treatments in MAU included none.   Pt stable at time of discharge.  Assessment: Yeast Vaginitis Probable Urinary Tract Infection (bacteria, leuks)  Plan: Discharge home Urine to culture Discussed findings, Will call if other tests positive Recommend push po fluids Rx sent for Septra DS  for UTI Rx sent for Diflucan and Terazol 7 for Yeast      Medication List    ASK your doctor about these medications        acetaminophen 500 MG tablet  Commonly known as:  TYLENOL  Take 1,000 mg by mouth every 6 (six) hours as needed for fever or headache (pain).     cyclobenzaprine 10 MG tablet  Commonly known as:  FLEXERIL  Take 1 tablet (10 mg total) by mouth 3 (three) times daily as needed for muscle spasms.     Naproxen  Sodium 220 MG Caps  Commonly known as:  ALEVE  Take 1 capsule (220 mg total) by mouth 2 (two) times daily with a meal.     norgestimate-ethinyl estradiol 0.25-35 MG-MCG tablet  Commonly known as:  ORTHO-CYCLEN,SPRINTEC,PREVIFEM  Take 1 tablet by mouth daily.       Encouraged to return here or to other Urgent Care/ED if she develops worsening of symptoms, increase in pain, fever, or other concerning symptoms.   Wynelle Bourgeois CNM, MSN Certified Nurse-Midwife 08/29/2015 8:47 PM

## 2015-08-29 NOTE — Discharge Instructions (Signed)
Probiotics WHAT ARE PROBIOTICS? Probiotics are the good bacteria and yeasts that live in your body and keep you and your digestive system healthy. Probiotics also help your body's defense (immune) system and protect your body against bad bacterial growth.  Certain foods contain probiotics, such as yogurt. Probiotics can also be purchased as a supplement. As with any supplement or drug, it is important to discuss its use with your health care provider.  WHAT AFFECTS THE BALANCE OF BACTERIA IN MY BODY? The balance of bacteria in your body can be affected by:   Antibiotic medicines. Antibiotics are sometimes necessary to treat infection. Unfortunately, they may kill good or friendly bacteria in your body as well as the bad bacteria. This may lead to stomach problems like diarrhea, gas, and cramping.  Disease. Some conditions are the result of an overgrowth of bad bacteria, yeasts, parasites, or fungi. These conditions include:   Infectious diarrhea.  Stomach and respiratory infections.  Skin infections.  Irritable bowel syndrome (IBS).  Inflammatory bowel diseases.  Ulcer due to Helicobacter pylori (H. pylori) infection.  Tooth decay and periodontal disease.  Vaginal infections. Stress and poor diet may also lower the good bacteria in your body.  WHAT TYPE OF PROBIOTIC IS RIGHT FOR ME? Probiotics are available over the counter at your local pharmacy, health food, or grocery store. They come in many different forms, combinations of strains, and dosing strengths. Some may need to be refrigerated. Always read the label for storage and usage instructions. Specific strains have been shown to be more effective for certain conditions. Ask your health care provider what option is best for you.  WHY WOULD I NEED PROBIOTICS? There are many reasons your health care provider might recommend a probiotic supplement, including:   Diarrhea.  Constipation.  IBS.  Respiratory infections.  Yeast  infections.  Acne, eczema, and other skin conditions.  Frequent urinary tract infections (UTIs). ARE THERE SIDE EFFECTS OF PROBIOTICS? Some people experience mild side effects when taking probiotics. Side effects are usually temporary and may include:   Gas.  Bloating.  Cramping. Rarely, serious side effects, such as infection or immune system changes, may occur. WHAT ELSE DO I NEED TO KNOW ABOUT PROBIOTICS?   There are many different strains of probiotics. Certain strains may be more effective depending on your condition. Probiotics are available in varying doses. Ask your health care provider which probiotic you should use and how often.   If you are taking probiotics along with antibiotics, it is generally recommended to wait at least 2 hours between taking the antibiotic and taking the probiotic.  FOR MORE INFORMATION:  [facility] [email or web address]   This information is not intended to replace advice given to you by your health care provider. Make sure you discuss any questions you have with your health care provider.   Document Released: 02/02/2014 Document Reviewed: 02/02/2014 Elsevier Interactive Patient Education 2016 Elsevier Inc.  Monilial Vaginitis Vaginitis in a soreness, swelling and redness (inflammation) of the vagina and vulva. Monilial vaginitis is not a sexually transmitted infection. CAUSES  Yeast vaginitis is caused by yeast (candida) that is normally found in your vagina. With a yeast infection, the candida has overgrown in number to a point that upsets the chemical balance. SYMPTOMS   White, thick vaginal discharge.  Swelling, itching, redness and irritation of the vagina and possibly the lips of the vagina (vulva).  Burning or painful urination.  Painful intercourse. DIAGNOSIS  Things that may contribute  to monilial vaginitis are:  Postmenopausal and virginal  states.  Pregnancy.  Infections.  Being tired, sick or stressed, especially if you had monilial vaginitis in the past.  Diabetes. Good control will help lower the chance.  Birth control pills.  Tight fitting garments.  Using bubble bath, feminine sprays, douches or deodorant tampons.  Taking certain medications that kill germs (antibiotics).  Sporadic recurrence can occur if you become ill. TREATMENT  Your caregiver will give you medication.  There are several kinds of anti monilial vaginal creams and suppositories specific for monilial vaginitis. For recurrent yeast infections, use a suppository or cream in the vagina 2 times a week, or as directed.  Anti-monilial or steroid cream for the itching or irritation of the vulva may also be used. Get your caregiver's permission.  Painting the vagina with methylene blue solution may help if the monilial cream does not work.  Eating yogurt may help prevent monilial vaginitis. HOME CARE INSTRUCTIONS   Finish all medication as prescribed.  Do not have sex until treatment is completed or after your caregiver tells you it is okay.  Take warm sitz baths.  Do not douche.  Do not use tampons, especially scented ones.  Wear cotton underwear.  Avoid tight pants and panty hose.  Tell your sexual partner that you have a yeast infection. They should go to their caregiver if they have symptoms such as mild rash or itching.  Your sexual partner should be treated as well if your infection is difficult to eliminate.  Practice safer sex. Use condoms.  Some vaginal medications cause latex condoms to fail. Vaginal medications that harm condoms are:  Cleocin cream.  Butoconazole (Femstat).  Terconazole (Terazol) vaginal suppository.  Miconazole (Monistat) (may be purchased over the counter). SEEK MEDICAL CARE IF:   You have a temperature by mouth above 102 F (38.9 C).  The infection is getting worse after 2 days of  treatment.  The infection is not getting better after 3 days of treatment.  You develop blisters in or around your vagina.  You develop vaginal bleeding, and it is not your menstrual period.  You have pain when you urinate.  You develop intestinal problems.  You have pain with sexual intercourse.   This information is not intended to replace advice given to you by your health care provider. Make sure you discuss any questions you have with your health care provider.   Document Released: 04/17/2005 Document Revised: 09/30/2011 Document Reviewed: 01/09/2015 Elsevier Interactive Patient Education 2016 Elsevier Inc. Urinary Tract Infection Urinary tract infections (UTIs) can develop anywhere along your urinary tract. Your urinary tract is your body's drainage system for removing wastes and extra water. Your urinary tract includes two kidneys, two ureters, a bladder, and a urethra. Your kidneys are a pair of bean-shaped organs. Each kidney is about the size of your fist. They are located below your ribs, one on each side of your spine. CAUSES Infections are caused by microbes, which are microscopic organisms, including fungi, viruses, and bacteria. These organisms are so small that they can only be seen through a microscope. Bacteria are the microbes that most commonly cause UTIs. SYMPTOMS  Symptoms of UTIs may vary by age and gender of the patient and by the location of the infection. Symptoms in young women typically include a frequent and intense urge to urinate and a painful, burning feeling in the bladder or urethra during urination. Older women and men are more likely to be tired, shaky, and weak  and have muscle aches and abdominal pain. A fever may mean the infection is in your kidneys. Other symptoms of a kidney infection include pain in your back or sides below the ribs, nausea, and vomiting. DIAGNOSIS To diagnose a UTI, your caregiver will ask you about your symptoms. Your caregiver  will also ask you to provide a urine sample. The urine sample will be tested for bacteria and white blood cells. White blood cells are made by your body to help fight infection. TREATMENT  Typically, UTIs can be treated with medication. Because most UTIs are caused by a bacterial infection, they usually can be treated with the use of antibiotics. The choice of antibiotic and length of treatment depend on your symptoms and the type of bacteria causing your infection. HOME CARE INSTRUCTIONS  If you were prescribed antibiotics, take them exactly as your caregiver instructs you. Finish the medication even if you feel better after you have only taken some of the medication.  Drink enough water and fluids to keep your urine clear or pale yellow.  Avoid caffeine, tea, and carbonated beverages. They tend to irritate your bladder.  Empty your bladder often. Avoid holding urine for long periods of time.  Empty your bladder before and after sexual intercourse.  After a bowel movement, women should cleanse from front to back. Use each tissue only once. SEEK MEDICAL CARE IF:   You have back pain.  You develop a fever.  Your symptoms do not begin to resolve within 3 days. SEEK IMMEDIATE MEDICAL CARE IF:   You have severe back pain or lower abdominal pain.  You develop chills.  You have nausea or vomiting.  You have continued burning or discomfort with urination. MAKE SURE YOU:   Understand these instructions.  Will watch your condition.  Will get help right away if you are not doing well or get worse.   This information is not intended to replace advice given to you by your health care provider. Make sure you discuss any questions you have with your health care provider.   Document Released: 04/17/2005 Document Revised: 03/29/2015 Document Reviewed: 08/16/2011 Elsevier Interactive Patient Education [employer]

## 2015-08-29 NOTE — MAU Note (Signed)
Pt states she has been experiencing pain with urination, bleeding in urine, and discomfort in back for 2-3 days now. Pt states she also needs to be checked for STD's.  Pt states she has been having some itching in her vagina.

## 2015-08-30 LAB — POCT PREGNANCY, URINE: PREG TEST UR: NEGATIVE

## 2015-08-30 LAB — RPR: RPR Ser Ql: NONREACTIVE

## 2015-08-30 LAB — HIV ANTIBODY (ROUTINE TESTING W REFLEX): HIV SCREEN 4TH GENERATION: NONREACTIVE

## 2015-08-30 LAB — GC/CHLAMYDIA PROBE AMP (~~LOC~~) NOT AT ARMC
Chlamydia: NEGATIVE
NEISSERIA GONORRHEA: NEGATIVE

## 2015-08-31 LAB — URINE CULTURE: Special Requests: NORMAL

## 2015-11-04 ENCOUNTER — Emergency Department (HOSPITAL_COMMUNITY)
Admission: EM | Admit: 2015-11-04 | Discharge: 2015-11-04 | Disposition: A | Discharge status: Home / Self Care | Payer: Medicaid Other | Attending: Emergency Medicine | Admitting: Emergency Medicine

## 2015-11-04 ENCOUNTER — Encounter (HOSPITAL_COMMUNITY): Payer: Self-pay | Admitting: *Deleted

## 2015-11-04 DIAGNOSIS — Z792 Long term (current) use of antibiotics: Secondary | ICD-10-CM | POA: Insufficient documentation

## 2015-11-04 DIAGNOSIS — N189 Chronic kidney disease, unspecified: Secondary | ICD-10-CM | POA: Insufficient documentation

## 2015-11-04 DIAGNOSIS — Y9389 Activity, other specified: Secondary | ICD-10-CM | POA: Diagnosis not present

## 2015-11-04 DIAGNOSIS — Z791 Long term (current) use of non-steroidal anti-inflammatories (NSAID): Secondary | ICD-10-CM | POA: Insufficient documentation

## 2015-11-04 DIAGNOSIS — Z793 Long term (current) use of hormonal contraceptives: Secondary | ICD-10-CM | POA: Insufficient documentation

## 2015-11-04 DIAGNOSIS — S0990XA Unspecified injury of head, initial encounter: Secondary | ICD-10-CM | POA: Diagnosis present

## 2015-11-04 DIAGNOSIS — S060X0A Concussion without loss of consciousness, initial encounter: Secondary | ICD-10-CM | POA: Diagnosis not present

## 2015-11-04 DIAGNOSIS — F17210 Nicotine dependence, cigarettes, uncomplicated: Secondary | ICD-10-CM | POA: Insufficient documentation

## 2015-11-04 DIAGNOSIS — Z8619 Personal history of other infectious and parasitic diseases: Secondary | ICD-10-CM | POA: Diagnosis not present

## 2015-11-04 DIAGNOSIS — Y92410 Unspecified street and highway as the place of occurrence of the external cause: Secondary | ICD-10-CM | POA: Diagnosis not present

## 2015-11-04 DIAGNOSIS — M419 Scoliosis, unspecified: Secondary | ICD-10-CM | POA: Insufficient documentation

## 2015-11-04 DIAGNOSIS — Y998 Other external cause status: Secondary | ICD-10-CM | POA: Diagnosis not present

## 2015-11-04 DIAGNOSIS — M549 Dorsalgia, unspecified: Secondary | ICD-10-CM

## 2015-11-04 DIAGNOSIS — S3992XA Unspecified injury of lower back, initial encounter: Secondary | ICD-10-CM | POA: Insufficient documentation

## 2015-11-04 DIAGNOSIS — Z3202 Encounter for pregnancy test, result negative: Secondary | ICD-10-CM | POA: Diagnosis not present

## 2015-11-04 LAB — I-STAT BETA HCG BLOOD, ED (MC, WL, AP ONLY): I-stat hCG, quantitative: <5 m[IU]/mL (ref <5)

## 2015-11-04 MED ORDER — NAPROXEN 500 MG PO TABS: 500.0000 mg | ORAL_TABLET | Freq: Two times a day (BID) | ORAL | Status: DC

## 2015-11-04 MED ORDER — METHOCARBAMOL 500 MG PO TABS: 500.0000 mg | ORAL_TABLET | Freq: Two times a day (BID) | ORAL | Status: DC

## 2015-11-04 NOTE — ED Notes (Signed)
Declined W/C at D/C and was escorted to lobby by RN. 
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
## 2015-11-04 NOTE — Discharge Instructions (Signed)
Concussion, Adult °A concussion, or closed-head injury, is a brain injury caused by a direct blow to the head or by a quick and sudden movement (jolt) of the head or neck. Concussions are usually not life-threatening. Even so, the effects of a concussion can be serious. If you have had a concussion before, you are more likely to experience concussion-like symptoms after a direct blow to the head.  °CAUSES °· Direct blow to the head, such as from running into another player during a soccer game, being hit in a fight, or hitting your head on a hard surface. °· A jolt of the head or neck that causes the brain to move back and forth inside the skull, such as in a car crash. °SIGNS AND SYMPTOMS °The signs of a concussion can be hard to notice. Early on, they may be missed by you, family members, and health care providers. You may look fine but act or feel differently. °Symptoms are usually temporary, but they may last for days, weeks, or even longer. Some symptoms may appear right away while others may not show up for hours or days. Every head injury is different. Symptoms include: °· Mild to moderate headaches that will not go away. °· A feeling of pressure inside your head. °· Having more trouble than usual: °· Learning or remembering things you have heard. °· Answering questions. °· Paying attention or concentrating. °· Organizing daily tasks. °· Making decisions and solving problems. °· Slowness in thinking, acting or reacting, speaking, or reading. °· Getting lost or being easily confused. °· Feeling tired all the time or lacking energy (fatigued). °· Feeling drowsy. °· Sleep disturbances. °· Sleeping more than usual. °· Sleeping less than usual. °· Trouble falling asleep. °· Trouble sleeping (insomnia). °· Loss of balance or feeling lightheaded or dizzy. °· Nausea or vomiting. °· Numbness or tingling. °· Increased sensitivity to: °· Sounds. °· Lights. °· Distractions. °· Vision problems or eyes that tire  easily. °· Diminished sense of taste or smell. °· Ringing in the ears. °· Mood changes such as feeling sad or anxious. °· Becoming easily irritated or angry for little or no reason. °· Lack of motivation. °· Seeing or hearing things other people do not see or hear (hallucinations). °DIAGNOSIS °Your health care provider can usually diagnose a concussion based on a description of your injury and symptoms. He or she will ask whether you passed out (lost consciousness) and whether you are having trouble remembering events that happened right before and during your injury. °Your evaluation might include: °· A brain scan to look for signs of injury to the brain. Even if the test shows no injury, you may still have a concussion. °· Blood tests to be sure other problems are not present. °TREATMENT °· Concussions are usually treated in an emergency department, in urgent care, or at a clinic. You may need to stay in the hospital overnight for further treatment. °· Tell your health care provider if you are taking any medicines, including prescription medicines, over-the-counter medicines, and natural remedies. Some medicines, such as blood thinners (anticoagulants) and aspirin, may increase the chance of complications. Also tell your health care provider whether you have had alcohol or are taking illegal drugs. This information may affect treatment. °· Your health care provider will send you home with important instructions to follow. °· How fast you will recover from a concussion depends on many factors. These factors include how severe your concussion is, what part of your brain was injured,   your age, and how healthy you were before the concussion. °· Most people with mild injuries recover fully. Recovery can take time. In general, recovery is slower in older persons. Also, persons who have had a concussion in the past or have other medical problems may find that it takes longer to recover from their current injury. °HOME  CARE INSTRUCTIONS °General Instructions °· Carefully follow the directions your health care provider gave you. °· Only take over-the-counter or prescription medicines for pain, discomfort, or fever as directed by your health care provider. °· Take only those medicines that your health care provider has approved. °· Do not drink alcohol until your health care provider says you are well enough to do so. Alcohol and certain other drugs may slow your recovery and can put you at risk of further injury. °· If it is harder than usual to remember things, write them down. °· If you are easily distracted, try to do one thing at a time. For example, do not try to watch TV while fixing dinner. °· Talk with family members or close friends when making important decisions. °· Keep all follow-up appointments. Repeated evaluation of your symptoms is recommended for your recovery. °· Watch your symptoms and tell others to do the same. Complications sometimes occur after a concussion. Older adults with a brain injury may have a higher risk of serious complications, such as a blood clot on the brain. °· Tell your teachers, school nurse, school counselor, coach, athletic trainer, or work manager about your injury, symptoms, and restrictions. Tell them about what you can or cannot do. They should watch for: °¨ Increased problems with attention or concentration. °¨ Increased difficulty remembering or learning new information. °¨ Increased time needed to complete tasks or assignments. °¨ Increased irritability or decreased ability to cope with stress. °¨ Increased symptoms. °· Rest. Rest helps the brain to heal. Make sure you: °¨ Get plenty of sleep at night. Avoid staying up late at night. °¨ Keep the same bedtime hours on weekends and weekdays. °¨ Rest during the day. Take daytime naps or rest breaks when you feel tired. °· Limit activities that require a lot of thought or concentration. These include: °¨ Doing homework or job-related  work. °¨ Watching TV. °¨ Working on the computer. °· Avoid any situation where there is potential for another head injury (football, hockey, soccer, basketball, martial arts, downhill snow sports and horseback riding). Your condition will get worse every time you experience a concussion. You should avoid these activities until you are evaluated by the appropriate follow-up health care providers. °Returning To Your Regular Activities °You will need to return to your normal activities slowly, not all at once. You must give your body and brain enough time for recovery. °· Do not return to sports or other athletic activities until your health care provider tells you it is safe to do so. °· Ask your health care provider when you can drive, ride a bicycle, or operate heavy machinery. Your ability to react may be slower after a brain injury. Never do these activities if you are dizzy. °· Ask your health care provider about when you can return to work or school. °Preventing Another Concussion °It is very important to avoid another brain injury, especially before you have recovered. In rare cases, another injury can lead to permanent brain damage, brain swelling, or death. The risk of this is greatest during the first 7-10 days after a head injury. Avoid injuries by: °· Wearing a   seat belt when riding in a car. °· Drinking alcohol only in moderation. °· Wearing a helmet when biking, skiing, skateboarding, skating, or doing similar activities. °· Avoiding activities that could lead to a second concussion, such as contact or recreational sports, until your health care provider says it is okay. °· Taking safety measures in your home. °¨ Remove clutter and tripping hazards from floors and stairways. °¨ Use grab bars in bathrooms and handrails by stairs. °¨ Place non-slip mats on floors and in bathtubs. °¨ Improve lighting in dim areas. °SEEK MEDICAL CARE IF: °· You have increased problems paying attention or  concentrating. °· You have increased difficulty remembering or learning new information. °· You need more time to complete tasks or assignments than before. °· You have increased irritability or decreased ability to cope with stress. °· You have more symptoms than before. °Seek medical care if you have any of the following symptoms for more than 2 weeks after your injury: °· Lasting (chronic) headaches. °· Dizziness or balance problems. °· Nausea. °· Vision problems. °· Increased sensitivity to noise or light. °· Depression or mood swings. °· Anxiety or irritability. °· Memory problems. °· Difficulty concentrating or paying attention. °· Sleep problems. °· Feeling tired all the time. °SEEK IMMEDIATE MEDICAL CARE IF: °· You have severe or worsening headaches. These may be a sign of a blood clot in the brain. °· You have weakness (even if only in one hand, leg, or part of the face). °· You have numbness. °· You have decreased coordination. °· You vomit repeatedly. °· You have increased sleepiness. °· One pupil is larger than the other. °· You have convulsions. °· You have slurred speech. °· You have increased confusion. This may be a sign of a blood clot in the brain. °· You have increased restlessness, agitation, or irritability. °· You are unable to recognize people or places. °· You have neck pain. °· It is difficult to wake you up. °· You have unusual behavior changes. °· You lose consciousness. °MAKE SURE YOU: °· Understand these instructions. °· Will watch your condition. °· Will get help right away if you are not doing well or get worse. °  °This information is not intended to replace advice given to you by your health care provider. Make sure you discuss any questions you have with your health care provider. °  °Document Released: 09/28/2003 Document Revised: 07/29/2014 Document Reviewed: 01/28/2013 °Elsevier Interactive Patient Education ©2016 [employer] ° °Motor Vehicle Collision °It is common to have  multiple bruises and sore muscles after a motor vehicle collision (MVC). These tend to feel worse for the first 24 hours. You may have the most stiffness and soreness over the first several hours. You may also feel worse when you wake up the first morning after your collision. After this point, you will usually begin to improve with each day. The speed of improvement often depends on the severity of the collision, the number of injuries, and the location and nature of these injuries. °HOME CARE INSTRUCTIONS °· Put ice on the injured area. °¨ Put ice in a plastic bag. °¨ Place a towel between your skin and the bag. °¨ Leave the ice on for 15-20 minutes, 3-4 times a day, or as directed by your health care provider. °· Drink enough fluids to keep your urine clear or pale yellow. Do not drink alcohol. °· Take a warm shower or bath once or twice a day. This will increase blood flow to sore   muscles. °· You may return to activities as directed by your caregiver. Be careful when lifting, as this may aggravate neck or back pain. °· Only take over-the-counter or prescription medicines for pain, discomfort, or fever as directed by your caregiver. Do not use aspirin. This may increase bruising and bleeding. °SEEK IMMEDIATE MEDICAL CARE IF: °· You have numbness, tingling, or weakness in the arms or legs. °· You develop severe headaches not relieved with medicine. °· You have severe neck pain, especially tenderness in the middle of the back of your neck. °· You have changes in bowel or bladder control. °· There is increasing pain in any area of the body. °· You have shortness of breath, light-headedness, dizziness, or fainting. °· You have chest pain. °· You feel sick to your stomach (nauseous), throw up (vomit), or sweat. °· You have increasing abdominal discomfort. °· There is blood in your urine, stool, or vomit. °· You have pain in your shoulder (shoulder strap areas). °· You feel your symptoms are getting worse. °MAKE SURE  YOU: °· Understand these instructions. °· Will watch your condition. °· Will get help right away if you are not doing well or get worse. °  °This information is not intended to replace advice given to you by your health care provider. Make sure you discuss any questions you have with your health care provider. °  °Document Released: 07/08/2005 Document Revised: 07/29/2014 Document Reviewed: 12/05/2010 °Elsevier Interactive Patient Education ©2016 [employer] ° °
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
## 2016-12-11 ENCOUNTER — Emergency Department (HOSPITAL_COMMUNITY)
Admission: EM | Admit: 2016-12-11 | Discharge: 2016-12-11 | Disposition: A | Discharge status: Home / Self Care | Payer: Medicaid Other | Attending: Emergency Medicine | Admitting: Emergency Medicine

## 2016-12-11 ENCOUNTER — Emergency Department (HOSPITAL_COMMUNITY): Payer: Medicaid Other

## 2016-12-11 ENCOUNTER — Encounter (HOSPITAL_COMMUNITY): Payer: Self-pay

## 2016-12-11 DIAGNOSIS — N189 Chronic kidney disease, unspecified: Secondary | ICD-10-CM | POA: Insufficient documentation

## 2016-12-11 DIAGNOSIS — Y999 Unspecified external cause status: Secondary | ICD-10-CM | POA: Insufficient documentation

## 2016-12-11 DIAGNOSIS — Y92410 Unspecified street and highway as the place of occurrence of the external cause: Secondary | ICD-10-CM | POA: Diagnosis not present

## 2016-12-11 DIAGNOSIS — M545 Low back pain: Secondary | ICD-10-CM | POA: Diagnosis not present

## 2016-12-11 DIAGNOSIS — F17210 Nicotine dependence, cigarettes, uncomplicated: Secondary | ICD-10-CM | POA: Insufficient documentation

## 2016-12-11 DIAGNOSIS — Y939 Activity, unspecified: Secondary | ICD-10-CM | POA: Insufficient documentation

## 2016-12-11 DIAGNOSIS — M25571 Pain in right ankle and joints of right foot: Secondary | ICD-10-CM | POA: Diagnosis not present

## 2016-12-11 DIAGNOSIS — R0789 Other chest pain: Secondary | ICD-10-CM | POA: Insufficient documentation

## 2016-12-11 DIAGNOSIS — S3992XA Unspecified injury of lower back, initial encounter: Secondary | ICD-10-CM | POA: Diagnosis present

## 2016-12-11 LAB — I-STAT CHEM 8, ED
BUN: 8 mg/dL (ref 6–20)
CALCIUM ION: 1.22 mmol/L (ref 1.15–1.40)
CHLORIDE: 105 mmol/L (ref 101–111)
Creatinine, Ser: 0.5 mg/dL (ref 0.44–1.00)
GLUCOSE: 127 mg/dL — AB (ref 65–99)
HCT: 37 % (ref 36.0–46.0)
HEMOGLOBIN: 12.6 g/dL (ref 12.0–15.0)
Potassium: 3.8 mmol/L (ref 3.5–5.1)
SODIUM: 140 mmol/L (ref 135–145)
TCO2: 26 mmol/L (ref 0–100)

## 2016-12-11 LAB — I-STAT BETA HCG BLOOD, ED (MC, WL, AP ONLY)

## 2016-12-11 MED ORDER — IBUPROFEN 800 MG PO TABS: 800.0000 mg | ORAL_TABLET | Freq: Three times a day (TID) | ORAL | 0 refills | Status: DC

## 2016-12-11 MED ORDER — HYDROMORPHONE HCL 1 MG/ML IJ SOLN
1.0000 mg | Freq: Once | INTRAMUSCULAR | Status: AC
  Administered 2016-12-11: 1 mg via INTRAMUSCULAR
  Filled 2016-12-11: qty 1

## 2016-12-11 NOTE — Discharge Instructions (Signed)
Follow up with dr. Eulah PontMurphy or your family md if not improving
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
## 2016-12-11 NOTE — ED Notes (Signed)
Got patient ready for othro tech

## 2016-12-11 NOTE — ED Notes (Signed)
Pt transported to and from radiology on stretcher with tech, tolerated well.  

## 2016-12-11 NOTE — Progress Notes (Signed)
Orthopedic Tech Progress Note Patient Details:  Catherine Morales 04/16/1991 [id number]  Ortho Devices Type of Ortho Device: ASO Ortho Device/Splint Location: rle Ortho Device/Splint Interventions: Application   Okey Duprerawford, Azuri Bozard 12/11/2016, 2:16 PM
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
## 2017-05-12 ENCOUNTER — Encounter (HOSPITAL_COMMUNITY): Payer: Self-pay

## 2017-05-12 ENCOUNTER — Inpatient Hospital Stay (HOSPITAL_COMMUNITY)
Admission: AD | Admit: 2017-05-12 | Discharge: 2017-05-12 | Disposition: A | Discharge status: Home / Self Care | Payer: Medicaid Other | Source: Ambulatory Visit | Attending: Obstetrics and Gynecology | Admitting: Obstetrics and Gynecology

## 2017-05-12 DIAGNOSIS — A5901 Trichomonal vulvovaginitis: Secondary | ICD-10-CM | POA: Diagnosis not present

## 2017-05-12 DIAGNOSIS — A599 Trichomoniasis, unspecified: Secondary | ICD-10-CM | POA: Diagnosis not present

## 2017-05-12 DIAGNOSIS — B9689 Other specified bacterial agents as the cause of diseases classified elsewhere: Secondary | ICD-10-CM | POA: Diagnosis not present

## 2017-05-12 DIAGNOSIS — R1030 Lower abdominal pain, unspecified: Secondary | ICD-10-CM | POA: Insufficient documentation

## 2017-05-12 DIAGNOSIS — N760 Acute vaginitis: Secondary | ICD-10-CM | POA: Diagnosis present

## 2017-05-12 DIAGNOSIS — Z3202 Encounter for pregnancy test, result negative: Secondary | ICD-10-CM | POA: Diagnosis not present

## 2017-05-12 DIAGNOSIS — F17210 Nicotine dependence, cigarettes, uncomplicated: Secondary | ICD-10-CM | POA: Diagnosis not present

## 2017-05-12 LAB — URINALYSIS, ROUTINE W REFLEX MICROSCOPIC
Bilirubin Urine: NEGATIVE
Glucose, UA: NEGATIVE mg/dL
Hgb urine dipstick: NEGATIVE
Ketones, ur: NEGATIVE mg/dL
Nitrite: NEGATIVE
PH: 6 (ref 5.0–8.0)
PROTEIN: NEGATIVE mg/dL
RBC / HPF: NONE SEEN RBC/hpf (ref 0–5)
Specific Gravity, Urine: 1.027 (ref 1.005–1.030)

## 2017-05-12 LAB — WET PREP, GENITAL
Sperm: NONE SEEN
YEAST WET PREP: NONE SEEN

## 2017-05-12 LAB — HCG, QUANTITATIVE, PREGNANCY: hCG, Beta Chain, Quant, S: <1 m[IU]/mL (ref <5)

## 2017-05-12 LAB — CBC
HEMATOCRIT: 35.3 % — AB (ref 36.0–46.0)
Hemoglobin: 11.9 g/dL — ABNORMAL LOW (ref 12.0–15.0)
MCH: 31.6 pg (ref 26.0–34.0)
MCHC: 33.7 g/dL (ref 30.0–36.0)
MCV: 93.9 fL (ref 78.0–100.0)
Platelets: 315 10*3/uL (ref 150–400)
RBC: 3.76 MIL/uL — ABNORMAL LOW (ref 3.87–5.11)
RDW: 13.2 % (ref 11.5–15.5)
WBC: 8.1 10*3/uL (ref 4.0–10.5)

## 2017-05-12 LAB — POCT PREGNANCY, URINE: PREG TEST UR: NEGATIVE

## 2017-05-12 MED ORDER — METRONIDAZOLE 500 MG PO TABS: 500.0000 mg | ORAL_TABLET | Freq: Two times a day (BID) | ORAL | 0 refills | Status: AC

## 2017-05-12 NOTE — MAU Provider Note (Signed)
History     CSN: [id number]  Arrival date and time: 05/12/17 1437   First Provider Initiated Contact with Patient 05/12/17 1638      Chief Complaint  Patient presents with  . suspected STD exposture  . Dysuria   HPI  Catherine Morales is a 26 y.o. 361P1001 non-pregnant female presenting to MAU lower abdominal pain and recent knowledge that s.o. slept with other women while she was incarcerated. She has slept with him since she has been out of jail; not using condoms. She suspects that her s.o. was sexually active with a female who is believed to have HSV. She wants to be tested for "everything". She thinks she might have a UTI, because her urine has a strong odor and she has some discomfort when she urinates. She saw blood when she urinated earlier.  Past Medical History:  Diagnosis Date  . Chlamydia   . Chronic kidney disease   . No pertinent past medical history   . Scoliosis     History reviewed. No pertinent surgical history.  Family History  Problem Relation Age of Onset  . Cancer Maternal Grandmother   . Cancer Paternal Grandmother     Social History  Substance Use Topics  . Smoking status: Current Every Day Smoker    Packs/day: 0.50    Types: Cigarettes  . Smokeless tobacco: Never Used     Comment: 1 CIGARETTES A DAY  . Alcohol use 0.0 oz/week     Comment: socially     Allergies: No Known Allergies  Prescriptions Prior to Admission  Medication Sig Dispense Refill Last Dose  . acetaminophen (TYLENOL) 500 MG tablet Take 1,000 mg by mouth every 6 (six) hours as needed for fever or headache (pain).   05/10/2017    Review of Systems Physical Exam   Blood pressure 105/65, pulse 67, temperature 98.4 F (36.9 C), temperature source Oral, resp. rate 18, weight 104.2 kg (229 lb 12 oz), last menstrual period 05/06/2017, SpO2 100 %.  Physical Exam  Constitutional: She is oriented to person, place, and time. She appears well-developed and well-nourished.  HENT:   Head: Normocephalic.  Eyes: Pupils are equal, round, and reactive to light.  Neck: Normal range of motion.  Cardiovascular: Normal rate.   Respiratory: Effort normal.  GI: Soft.  Genitourinary:  Genitourinary Comments: Uterus: non-tender, cx: smooth, pink, no lesions, small amt of clear, thin vaginal d/c, closed/long/firm, no CMT or friability, no adnexal tenderness   Musculoskeletal: Normal range of motion.  Neurological: She is alert and oriented to person, place, and time.  Skin: Skin is warm and dry.  Psychiatric: She has a normal mood and affect. Her behavior is normal. Judgment and thought content normal.    MAU Course  Procedures  MDM CCUA UPT Wet Prep  GC/CT HIV RPR  Results for orders placed or performed during the hospital encounter of 05/12/17 (from the past 24 hour(s))  Urinalysis, Routine w reflex microscopic     Status: Abnormal   Collection Time: 05/12/17  3:04 PM  Result Value Ref Range   Color, Urine YELLOW YELLOW   APPearance HAZY (A) CLEAR   Specific Gravity, Urine 1.027 1.005 - 1.030   pH 6.0 5.0 - 8.0   Glucose, UA NEGATIVE NEGATIVE mg/dL   Hgb urine dipstick NEGATIVE NEGATIVE   Bilirubin Urine NEGATIVE NEGATIVE   Ketones, ur NEGATIVE NEGATIVE mg/dL   Protein, ur NEGATIVE NEGATIVE mg/dL   Nitrite NEGATIVE NEGATIVE   Leukocytes, UA TRACE (A)  NEGATIVE   RBC / HPF NONE SEEN 0 - 5 RBC/hpf   WBC, UA 0-5 0 - 5 WBC/hpf   Bacteria, UA RARE (A) NONE SEEN   Squamous Epithelial / LPF 0-5 (A) NONE SEEN   Mucus PRESENT   Pregnancy, urine POC     Status: None   Collection Time: 05/12/17  3:11 PM  Result Value Ref Range   Preg Test, Ur NEGATIVE NEGATIVE  Wet prep, genital     Status: Abnormal   Collection Time: 05/12/17  4:54 PM  Result Value Ref Range   Yeast Wet Prep HPF POC NONE SEEN NONE SEEN   Trich, Wet Prep PRESENT (A) NONE SEEN   Clue Cells Wet Prep HPF POC PRESENT (A) NONE SEEN   WBC, Wet Prep HPF POC MODERATE (A) NONE SEEN   Sperm NONE SEEN    CBC     Status: Abnormal   Collection Time: 05/12/17  4:56 PM  Result Value Ref Range   WBC 8.1 4.0 - 10.5 K/uL   RBC 3.76 (L) 3.87 - 5.11 MIL/uL   Hemoglobin 11.9 (L) 12.0 - 15.0 g/dL   HCT 16.1 (L) 09.6 - 04.5 %   MCV 93.9 78.0 - 100.0 fL   MCH 31.6 26.0 - 34.0 pg   MCHC 33.7 30.0 - 36.0 g/dL   RDW 40.9 81.1 - 91.4 %   Platelets 315 150 - 400 K/uL   Expedited Partner Therapy:  Information Sheet for Patients and Partners               You have been offered expedited partner therapy (EPT). This information sheet contains important information and warnings you need to be aware of, so please read it carefully.   Expedited Partner Therapy (EPT) is the clinical practice of treating the sexual partners of persons who receive chlamydia, gonorrhea, or trichomoniasis diagnoses by providing medications or prescriptions to the patient. Patients then provide partners with these therapies without the health-care provider having examined the partner. In other words, EPT is a convenient, fast and private way for patients to help their sexual partners get treated.   Chlamydia and gonorrhea are bacterial infections you get from having sex with a person who is already infected. Trichomoniasis (or "trich") is a very common sexually transmitted infection (STI) that is caused by infection with a protozoan parasite called Trichomonas vaginalis.  Many people with these infections don't know it because they feel fine, but without treatment these infections can cause serious health problems, such as pelvic inflammatory disease, ectopic pregnancy, infertility and increased risk of HIV.   It is important to get treated as soon as possible to protect your health, to avoid spreading these infections to others, and to prevent yourself from becoming re-infected. The good news is these infections can be easily cured with proper antibiotic medicine. The best way to take care of your self is to see a doctor or go to your  local health department. If you are not able to see a doctor or other medical provider, you should take EPT.    Recommended Medication: EPT for Chlamydia:  Azithromycin (Zithromax) 1 gram orally in a single dose EPT for Gonorrhea:  Cefixime (Suprax) 400 milligrams orally in a single dose PLUS azithromycin (Zithromax) 1 gram orally in a single dose EPT for Trichomoniasis:  Metronidazole (Flagyl) 2 grams orally in a single dose   These medicines are very safe. However, you should not take them if you have ever had an allergic reaction (  like a rash) to any of these medicines: azithromycin (Zithromax), erythromycin, clarithromycin (Biaxin), metronidazole (Flagyl), tinidazole (Tindimax). If you are uncertain about whether you have an allergy, call your medical provider or pharmacist before taking this medicine. If you have a serious, long-term illness like kidney, liver or heart disease, colitis or stomach problems, or you are currently taking other prescription medication, talk to your provider before taking this medication.   Women: If you have lower belly pain, pain during sex, vomiting, or a fever, do not take this medicine. Instead, you should see a medical provider to be certain you do not have pelvic inflammatory disease (PID). PID can be serious and lead to infertility, pregnancy problems or chronic pelvic pain.   Pregnant Women: It is very important for you to see a doctor to get pregnancy services and pre-natal care. These antibiotics for EPT are safe for pregnant women, but you still need to see a medical provider as soon as possible. It is also important to note that Doxycycline is an alternative therapy for chlamydia, but it should not be taken by someone who is pregnant.   Men: If you have pain or swelling in the testicles or a fever, do not take this medicine and see a medical provider.     Men who have sex with men (MSM): MSM in [place] continue to experience high rates of syphilis  and HIV. Many MSM with gonorrhea or chlamydia could also have syphilis and/or HIV and not know it. If you are a man who has sex with other men, it is very important that you see a medical provider and are tested for HIV and syphilis. EPT is not recommended for gonorrhea for MSM.  Recommended treatment for gonorrhea for MSM is Rocephin (shot) AND azithromycin due to decreased cure rate.  Please see your medical provider if this is the case.    Along with this information sheet is a prescription for the medicine. If you receive a prescription it will be in your name and will indicate your date of birth, or it will be in the name of "Expedited Partner Therapy".   In either case, you can have the prescription filled at a pharmacy. You will be responsible for the cost of the medicine, unless you have prescription drug coverage. In that case, you could provide your name so the pharmacy could bill your health plan.   Take the medication as directed. Some people will have a mild, upset stomach, which does not last long. AVOID alcohol 24 hours after taking metronidazole (Flagyl) to reduce the possibility of a disulfiram-like reaction (severe vomiting and abdominal pain).  After taking the medicine, do not have sex for 7 days. Do not share this medicine or give it to anyone else. It is important to tell everyone you have had sex with in the last 60 days that they need to go and get tested for sexually transmitted infections.   Ways to prevent these and other sexually transmitted infections (STIs):   Marland Kitchen Abstain from sex. This is the only sure way to avoid getting an STI.  Marland Kitchen Use barrier methods, such as condoms, consistently and correctly.  . Limit the number of sexual partners.  . Have regular physical exams, including testing for STIs.   For more information about EPT or other issues pertaining to an STI, please contact your medical provider or the [facility] Department at [phone] or  [email or web address]    Assessment and Plan  Trichomonosis -  Rx for Flagyl 500 mg PO BID x 7 days (dual tx for BV) - Expedited partner Tx  - Information provided on BV, Trich and Flagyl  Bacterial vaginitis - See Rx above  Discharge home Patient verbalized an understanding of the plan of care and agrees.  Raelyn Mora, MSN, CNM 05/12/2017, 4:40 PM

## 2017-05-12 NOTE — MAU Note (Addendum)
Having some pain in lower abd.  Wanting to get checked for everything.  Recently got out of prison.  Her boyfriend been messing with someone that has herpes.  She been messing with him too, so she want to be tested for everything, wants a preg test also.  Thinks she might have a urinary tract infection.  Her pee has a strong odor, some discomfort when she pees, saw some blood when she peed earlier.

## 2017-05-12 NOTE — Discharge Instructions (Signed)
Expedited Partner Therapy:  °Information Sheet for Patients and Partners  °            ° °You have been offered expedited partner therapy (EPT). This information sheet contains important information and warnings you need to be aware of, so please read it carefully.  ° °Expedited Partner Therapy (EPT) is the clinical practice of treating the sexual partners of persons who receive chlamydia, gonorrhea, or trichomoniasis diagnoses by providing medications or prescriptions to the patient. Patients then provide partners with these therapies without the health-care provider having examined the partner. In other words, EPT is a convenient, fast and private way for patients to help their sexual partners get treated.  ° °Chlamydia and gonorrhea are bacterial infections you get from having sex with a person who is already infected. Trichomoniasis (or “trich”) is a very common sexually transmitted infection (STI) that is caused by infection with a protozoan parasite called Trichomonas vaginalis.  Many people with these infections don’t know it because they feel fine, but without treatment these infections can cause serious health problems, such as pelvic inflammatory disease, ectopic pregnancy, infertility and increased risk of HIV.  ° °It is important to get treated as soon as possible to protect your health, to avoid spreading these infections to others, and to prevent yourself from becoming re-infected. The good news is these infections can be easily cured with proper antibiotic medicine. The best way to take care of your self is to see a doctor or go to your local health department. If you are not able to see a doctor or other medical provider, you should take EPT.  ° ° °Recommended Medication: °EPT for Chlamydia:  Azithromycin (Zithromax) 1 gram orally in a single dose °EPT for Gonorrhea:  Cefixime (Suprax) 400 milligrams orally in a single dose PLUS azithromycin (Zithromax) 1 gram orally in a single dose °EPT for  Trichomoniasis:  Metronidazole (Flagyl) 2 grams orally in a single dose ° ° °These medicines are very safe. However, you should not take them if you have ever had an allergic reaction (like a rash) to any of these medicines: azithromycin (Zithromax), erythromycin, clarithromycin (Biaxin), metronidazole (Flagyl), tinidazole (Tindimax). If you are uncertain about whether you have an allergy, call your medical provider or pharmacist before taking this medicine. If you have a serious, long-term illness like kidney, liver or heart disease, colitis or stomach problems, or you are currently taking other prescription medication, talk to your provider before taking this medication.  ° °Women: If you have lower belly pain, pain during sex, vomiting, or a fever, do not take this medicine. Instead, you should see a medical provider to be certain you do not have pelvic inflammatory disease (PID). PID can be serious and lead to infertility, pregnancy problems or chronic pelvic pain.  ° °Pregnant Women: It is very important for you to see a doctor to get pregnancy services and pre-natal care. These antibiotics for EPT are safe for pregnant women, but you still need to see a medical provider as soon as possible. It is also important to note that Doxycycline is an alternative therapy for chlamydia, but it should not be taken by someone who is pregnant.  ° °Men: If you have pain or swelling in the testicles or a fever, do not take this medicine and see a medical provider.    ° °Men who have sex with men (MSM): MSM in [place] continue to experience high rates of syphilis and HIV. Many MSM with gonorrhea or   chlamydia could also have syphilis and/or HIV and not know it. If you are a man who has sex with other men, it is very important that you see a medical provider and are tested for HIV and syphilis. EPT is not recommended for gonorrhea for MSM.  Recommended treatment for gonorrhea for MSM is Rocephin (shot) AND azithromycin  due to decreased cure rate.  Please see your medical provider if this is the case.   ° °Along with this information sheet is a prescription for the medicine. If you receive a prescription it will be in your name and will indicate your date of birth, or it will be in the name of “Expedited Partner Therapy”.   In either case, you can have the prescription filled at a pharmacy. You will be responsible for the cost of the medicine, unless you have prescription drug coverage. In that case, you could provide your name so the pharmacy could bill your health plan.  ° °Take the medication as directed. Some people will have a mild, upset stomach, which does not last long. AVOID alcohol 24 hours after taking metronidazole (Flagyl) to reduce the possibility of a disulfiram-like reaction (severe vomiting and abdominal pain).  After taking the medicine, do not have sex for 7 days. Do not share this medicine or give it to anyone else. It is important to tell everyone you have had sex with in the last 60 days that they need to go and get tested for sexually transmitted infections.  ° °Ways to prevent these and other sexually transmitted infections (STIs):  ° °• Abstain from sex. This is the only sure way to avoid getting an STI.  °• Use barrier methods, such as condoms, consistently and correctly.  °• Limit the number of sexual partners.  °• Have regular physical exams, including testing for STIs.  ° °For more information about EPT or other issues pertaining to an STI, please contact your medical provider or the [facility] at [phone] or [email or web address]   ° °

## 2017-05-13 LAB — GC/CHLAMYDIA PROBE AMP (~~LOC~~) NOT AT ARMC
Chlamydia: NEGATIVE
NEISSERIA GONORRHEA: NEGATIVE

## 2017-05-13 LAB — HEPATITIS C ANTIBODY: HCV Ab: <0.1 s/co ratio (ref 0.0–0.9)

## 2017-05-13 LAB — HIV ANTIBODY (ROUTINE TESTING W REFLEX): HIV SCREEN 4TH GENERATION: NONREACTIVE

## 2017-05-13 LAB — HSV 2 ANTIBODY, IGG: HSV 2 Glycoprotein G Ab, IgG: <0.91 index (ref 0.00–0.90)

## 2017-05-13 LAB — RPR: RPR Ser Ql: NONREACTIVE

## 2017-05-13 LAB — HSV 1 ANTIBODY, IGG

## 2017-06-04 ENCOUNTER — Inpatient Hospital Stay (HOSPITAL_COMMUNITY)
Admission: AD | Admit: 2017-06-04 | Discharge: 2017-06-04 | Disposition: A | Discharge status: Home / Self Care | Payer: Medicaid Other | Source: Ambulatory Visit | Attending: Obstetrics and Gynecology | Admitting: Obstetrics and Gynecology

## 2017-06-04 ENCOUNTER — Encounter (HOSPITAL_COMMUNITY): Payer: Self-pay | Admitting: *Deleted

## 2017-06-04 DIAGNOSIS — N939 Abnormal uterine and vaginal bleeding, unspecified: Secondary | ICD-10-CM | POA: Insufficient documentation

## 2017-06-04 DIAGNOSIS — Z5321 Procedure and treatment not carried out due to patient leaving prior to being seen by health care provider: Secondary | ICD-10-CM | POA: Insufficient documentation

## 2017-06-04 LAB — URINALYSIS, ROUTINE W REFLEX MICROSCOPIC
BILIRUBIN URINE: NEGATIVE
Glucose, UA: NEGATIVE mg/dL
KETONES UR: NEGATIVE mg/dL
Nitrite: NEGATIVE
PROTEIN: NEGATIVE mg/dL
Specific Gravity, Urine: 1.013 (ref 1.005–1.030)
pH: 7 (ref 5.0–8.0)

## 2017-06-04 LAB — POCT PREGNANCY, URINE: PREG TEST UR: NEGATIVE

## 2017-06-04 NOTE — MAU Note (Signed)
Pt not in lobby x2 

## 2017-06-04 NOTE — MAU Note (Signed)
Pt not in lobby x1 

## 2017-06-04 NOTE — MAU Note (Signed)
Pt not in lobby x3 

## 2017-06-04 NOTE — MAU Note (Signed)
Pt states she was here and was treated for trich but did not finish her meds because she started bleeding and she has been bleeding since that visit.

## 2017-06-05 ENCOUNTER — Inpatient Hospital Stay (HOSPITAL_COMMUNITY)
Admission: AD | Admit: 2017-06-05 | Discharge: 2017-06-05 | Disposition: A | Discharge status: Home / Self Care | Payer: Medicaid Other | Source: Ambulatory Visit | Attending: Obstetrics & Gynecology | Admitting: Obstetrics & Gynecology

## 2017-06-05 ENCOUNTER — Other Ambulatory Visit: Payer: Self-pay

## 2017-06-05 ENCOUNTER — Encounter (HOSPITAL_COMMUNITY): Payer: Self-pay

## 2017-06-05 DIAGNOSIS — R109 Unspecified abdominal pain: Secondary | ICD-10-CM | POA: Diagnosis not present

## 2017-06-05 DIAGNOSIS — N939 Abnormal uterine and vaginal bleeding, unspecified: Secondary | ICD-10-CM | POA: Diagnosis not present

## 2017-06-05 DIAGNOSIS — F17210 Nicotine dependence, cigarettes, uncomplicated: Secondary | ICD-10-CM | POA: Diagnosis not present

## 2017-06-05 DIAGNOSIS — D649 Anemia, unspecified: Secondary | ICD-10-CM | POA: Diagnosis not present

## 2017-06-05 LAB — CBC
HCT: 34.5 % — ABNORMAL LOW (ref 36.0–46.0)
Hemoglobin: 11.3 g/dL — ABNORMAL LOW (ref 12.0–15.0)
MCH: 30.5 pg (ref 26.0–34.0)
MCHC: 32.8 g/dL (ref 30.0–36.0)
MCV: 93 fL (ref 78.0–100.0)
PLATELETS: 334 10*3/uL (ref 150–400)
RBC: 3.71 MIL/uL — AB (ref 3.87–5.11)
RDW: 12.4 % (ref 11.5–15.5)
WBC: 5.5 10*3/uL (ref 4.0–10.5)

## 2017-06-05 MED ORDER — IBUPROFEN 600 MG PO TABS: 600.0000 mg | ORAL_TABLET | Freq: Four times a day (QID) | ORAL | 1 refills | Status: DC | PRN

## 2017-06-05 NOTE — MAU Provider Note (Signed)
Chief Complaint:  Vaginal Bleeding and Vaginal Discharge   First Provider Initiated Contact with Patient 06/05/17 0907      HPI: Catherine Morales is a 26 y.o. G1P1001 who presents to maternity admissions reporting heavy bleeding with a prolonged period.  Has taken ibuprofen for cramps which has helped.  Bleeding did decrease with ibuprofen.  States took all but two of metronidazole.  . She reports vaginal bleeding, but no vaginal itching/burning, urinary symptoms, h/a, dizziness, n/v, or fever/chills.    Vaginal Bleeding  The current episode started in the past 7 days. The problem occurs constantly. The problem has been unchanged. The pain is mild. Associated symptoms include abdominal pain. Pertinent negatives include no chills, constipation, diarrhea, fever, nausea or vomiting. The vaginal discharge was bloody. The vaginal bleeding is typical of menses. Nothing aggravates the symptoms. She has tried nothing for the symptoms.    RN note: Pt came in yesterday with bleeding and discharge and left before being called back to be seen. She presents today with the same complaints she states that she started bleeding over a month ago and it remains constant.  She also states that she is having abnormal Bennick discharge.  She states that she has constant abdominal pain rating it 8/10.  She was treated last month for trich and thought that maybe the medication was causing the bleeding and discharge so she states that she did not finish the medication.     Past Medical History: Past Medical History:  Diagnosis Date  . Chlamydia   . Chronic kidney disease   . No pertinent past medical history   . Scoliosis     Past obstetric history: OB History  Gravida Para Term Preterm AB Living  1 1 1     1   SAB TAB Ectopic Multiple Live Births               # Outcome Date GA Lbr Len/2nd Weight Sex Delivery Anes PTL Lv  1 Term 2012 [id number]            Complications: Teen pregnancy    Obstetric  Comments  Age 26 with first baby    Past Surgical History: History reviewed. No pertinent surgical history.  Family History: Family History  Problem Relation Age of Onset  . Cancer Maternal Grandmother   . Cancer Paternal Grandmother     Social History: Social History   Tobacco Use  . Smoking status: Current Every Day Smoker    Packs/day: 0.50    Types: Cigarettes  . Smokeless tobacco: Never Used  . Tobacco comment: 1 CIGARETTES A DAY  Substance Use Topics  . Alcohol use: Yes    Alcohol/week: 0.0 oz    Comment: socially   . Drug use: No    Allergies: No Known Allergies  Meds:  Medications Prior to Admission  Medication Sig Dispense Refill Last Dose  . acetaminophen (TYLENOL) 500 MG tablet Take 1,000 mg by mouth every 6 (six) hours as needed for fever or headache (pain).   05/10/2017    I have reviewed patient's Past Medical Hx, Surgical Hx, Family Hx, Social Hx, medications and allergies.  ROS:  Review of Systems  Constitutional: Negative for chills and fever.  Respiratory: Negative for shortness of breath.   Gastrointestinal: Positive for abdominal pain. Negative for constipation, diarrhea, nausea and vomiting.  Genitourinary: Positive for menstrual problem and vaginal bleeding.   Other systems negative     Physical Exam   Patient Vitals for the  past 24 hrs:  BP Temp Temp src Pulse Resp SpO2 Height Weight  06/05/17 0901 117/69 98.6 F (37 C) Oral 78 18 99 % 5\' 6"  (1.676 m) 225 lb (102.1 kg)   Constitutional: Well-developed, well-nourished female in no acute distress.  Cardiovascular: normal rate and rhythm, no ectopy audible, S1 & S2 heard, no murmur Respiratory: normal effort, no distress. Lungs CTAB with no wheezes or crackles GI: Abd soft, non-tender.  Nondistended.  No rebound, No guarding.  Bowel Sounds audible  MS: Extremities nontender, no edema, normal ROM Neurologic: Alert and oriented x 4.   Grossly nonfocal. GU: Neg CVAT. Skin:  Warm and  Dry Psych:  Affect appropriate.  PELVIC EXAM: Cervix pink, visually closed, without lesion, small amount of dark red blood, vaginal walls and external genitalia normal Bimanual exam: Cervix firm, anterior, neg CMT, uterus nontender, nonenlarged, adnexa without tenderness, enlargement, or mass    Labs: Results for orders placed or performed during the hospital encounter of 06/05/17 (from the past 24 hour(s))  CBC     Status: Abnormal   Collection Time: 06/05/17  9:42 AM  Result Value Ref Range   WBC 5.5 4.0 - 10.5 K/uL   RBC 3.71 (L) 3.87 - 5.11 MIL/uL   Hemoglobin 11.3 (L) 12.0 - 15.0 g/dL   HCT 16.134.5 (L) 09.636.0 - 04.546.0 %   MCV 93.0 78.0 - 100.0 fL   MCH 30.5 26.0 - 34.0 pg   MCHC 32.8 30.0 - 36.0 g/dL   RDW 40.912.4 81.111.5 - 91.415.5 %   Platelets 334 150 - 400 K/uL   UPT Neg   Ref. Range 05/12/2017 16:56  Hemoglobin Latest Ref Range: 12.0 - 15.0 g/dL 78.211.9 (L)   Imaging:  No results found.  MAU Course/MDM: I have ordered labs as follows:CBC which showed no significant drop in hemoglobin Imaging ordered: none Results reviewed.    Treatments in MAU included none.   Pt stable at time of discharge.  Assessment: Abnormal Uterine Bleeding Mild anemia  Plan: Discharge home Recommend Provera or OCPs.  Patient declines.  She wants to wait it out.   Discussed cycle will likely normalize when she ovulates next time Rx sent for ibuprofen for cramps and may help diminish bleeding due to antiprostaglandin effect   Encouraged to return here or to other Urgent Care/ED if she develops worsening of symptoms, increase in pain, fever, or other concerning symptoms.   Catherine Morales CNM, MSN Certified Nurse-Midwife 06/05/2017 9:30 AM

## 2017-06-05 NOTE — Discharge Instructions (Signed)
Abnormal Uterine Bleeding Abnormal uterine bleeding can affect women at various stages in life, including teenagers, women in their reproductive years, pregnant women, and women who have reached menopause. Several kinds of uterine bleeding are considered abnormal, including:  Bleeding or spotting between periods.  Bleeding after sexual intercourse.  Bleeding that is heavier or more than normal.  Periods that last longer than usual.  Bleeding after menopause. Many cases of abnormal uterine bleeding are minor and simple to treat, while others are more serious. Any type of abnormal bleeding should be evaluated by your health care provider. Treatment will depend on the cause of the bleeding. Follow these instructions at home: Monitor your condition for any changes. The following actions may help to alleviate any discomfort you are experiencing:  Avoid the use of tampons and douches as directed by your health care provider.  Change your pads frequently. You should get regular pelvic exams and Pap tests. Keep all follow-up appointments for diagnostic tests as directed by your health care provider. Contact a health care provider if:  Your bleeding lasts more than 1 week.  You feel dizzy at times. Get help right away if:  You pass out.  You are changing pads every 15 to 30 minutes.  You have abdominal pain.  You have a fever.  You become sweaty or weak.  You are passing large blood clots from the vagina.  You start to feel nauseous and vomit. This information is not intended to replace advice given to you by your health care provider. Make sure you discuss any questions you have with your health care provider. Document Released: 07/08/2005 Document Revised: 12/20/2015 Document Reviewed: 02/04/2013 Elsevier Interactive Patient Education  2017 [employer]  

## 2017-06-05 NOTE — MAU Note (Signed)
Pt came in yesterday with bleeding and discharge and left before being called back to be seen.  She presents today with the same complaints she states that she started bleeding over a month ago and it remains constant.   She also states that she is having abnormal Lieske discharge.   She states that she has constant abdominal pain rating it 8/10.   She was treated last month for trich and thought that maybe the medication was causing the bleeding and discharge so she states that she did not finish the medication.

## 2017-08-10 ENCOUNTER — Encounter (HOSPITAL_COMMUNITY): Payer: Self-pay | Admitting: Emergency Medicine

## 2017-08-10 ENCOUNTER — Emergency Department (HOSPITAL_COMMUNITY)
Admission: EM | Admit: 2017-08-10 | Discharge: 2017-08-11 | Disposition: A | Discharge status: Home / Self Care | Payer: Medicaid Other | Attending: Emergency Medicine | Admitting: Emergency Medicine

## 2017-08-10 DIAGNOSIS — N189 Chronic kidney disease, unspecified: Secondary | ICD-10-CM | POA: Diagnosis not present

## 2017-08-10 DIAGNOSIS — J111 Influenza due to unidentified influenza virus with other respiratory manifestations: Secondary | ICD-10-CM | POA: Diagnosis not present

## 2017-08-10 DIAGNOSIS — J029 Acute pharyngitis, unspecified: Secondary | ICD-10-CM | POA: Diagnosis present

## 2017-08-10 DIAGNOSIS — F17210 Nicotine dependence, cigarettes, uncomplicated: Secondary | ICD-10-CM | POA: Diagnosis not present

## 2017-08-10 DIAGNOSIS — R69 Illness, unspecified: Secondary | ICD-10-CM

## 2017-08-10 LAB — RAPID STREP SCREEN (MED CTR MEBANE ONLY): Streptococcus, Group A Screen (Direct): NEGATIVE

## 2017-08-10 MED ORDER — ACETAMINOPHEN 325 MG PO TABS
650.0000 mg | ORAL_TABLET | Freq: Once | ORAL | Status: AC
  Administered 2017-08-11: 650 mg via ORAL
  Filled 2017-08-10: qty 2

## 2017-08-10 MED ORDER — DEXAMETHASONE SODIUM PHOSPHATE 10 MG/ML IJ SOLN
10.0000 mg | Freq: Once | INTRAMUSCULAR | Status: AC
  Administered 2017-08-11: 10 mg via INTRAMUSCULAR
  Filled 2017-08-10: qty 1

## 2017-08-10 MED ORDER — IBUPROFEN 200 MG PO TABS
600.0000 mg | ORAL_TABLET | Freq: Once | ORAL | Status: AC
  Administered 2017-08-11: 600 mg via ORAL
  Filled 2017-08-10: qty 1

## 2017-08-10 NOTE — ED Triage Notes (Signed)
Pt c/o sore throat, generalized body aches and fevers since last night. Reports taking tylenol "right before coming here".

## 2017-08-11 MED ORDER — IBUPROFEN 600 MG PO TABS: 600.0000 mg | ORAL_TABLET | Freq: Four times a day (QID) | ORAL | 1 refills | Status: DC | PRN

## 2017-08-11 MED ORDER — ACETAMINOPHEN 325 MG PO TABS: 650.0000 mg | ORAL_TABLET | Freq: Four times a day (QID) | ORAL | 0 refills | Status: DC | PRN

## 2017-08-11 NOTE — ED Notes (Signed)
MD made aware of patients vital signs prior to RN discharging patient

## 2017-08-11 NOTE — ED Provider Notes (Addendum)
[facility]   CSN: [id number] Arrival date & time: 08/10/17  2117     History   Chief Complaint Chief Complaint  Patient presents with  . Sore Throat    HPI Catherine Morales is a 27 y.o. female.  HPI 27 year old female comes in with chief complaint of sore throat, runny nose, fevers, chills, body aches.  Patient states that her symptoms started 2 days ago, but the fever started last night.  Temperature at arrival was 101.8.  Patient denies any associated nausea, vomiting, diarrhea, abdominal pain, UTI-like symptoms.    Past Medical History:  Diagnosis Date  . Chlamydia   . Chronic kidney disease   . No pertinent past medical history   . Scoliosis     Patient Active Problem List   Diagnosis Date Noted  . 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 Bacterial vaginitis 05/12/2017  . Chronic low back pain 04/21/2015  . Preventative health care 04/05/2015  . Pyelonephritis 04/02/2015  . 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 UNSPECIFIED VAGINITIS AND VULVOVAGINITIS 08/08/2010    History reviewed. No pertinent surgical history.  OB History    Gravida Para Term Preterm AB Living   1 1 1     1    SAB TAB Ectopic Multiple Live Births                  Obstetric Comments   Age 27 with first baby       Home Medications    Prior to Admission medications   Medication Sig Start Date End Date Taking? Authorizing Provider  acetaminophen (TYLENOL) 325 MG tablet Take 2 tablets (650 mg total) by mouth every 6 (six) hours as needed. 08/11/17   Derwood KaplanNanavati, Yaakov Saindon, MD  ibuprofen (ADVIL,MOTRIN) 600 MG tablet Take 1 tablet (600 mg total) by mouth every 6 (six) hours as needed. 08/11/17   Derwood KaplanNanavati, Verdis Bassette, MD    Family History Family History  Problem Relation Age of Onset  . Cancer Maternal Grandmother   . Cancer Paternal Grandmother     Social History Social History   Tobacco Use  . Smoking status: Current Every Day  Smoker    Packs/day: 0.50    Types: Cigarettes  . Smokeless tobacco: Never Used  . Tobacco comment: 1 CIGARETTES A DAY  Substance Use Topics  . Alcohol use: Yes    Alcohol/week: 0.0 oz    Comment: socially   . Drug use: No     Allergies   Patient has no known allergies.   Review of Systems Review of Systems  Constitutional: Positive for chills and fever.  HENT: Positive for congestion and sore throat.   Musculoskeletal: Positive for myalgias.  Neurological: Positive for weakness and headaches.     Physical Exam Updated Vital Signs BP 102/67 (BP Location: Right Arm)   Pulse (!) 110   Temp (!) 101.7 F (38.7 C) (Oral)   Ht 5\' 5"  (1.651 m)   SpO2 100%   BMI 37.44 kg/m   Physical Exam  Constitutional: She is oriented to person, place, and time. She appears well-developed.  HENT:  Head: Normocephalic and atraumatic.  Mouth/Throat: Mucous membranes are normal. Posterior oropharyngeal edema and posterior oropharyngeal erythema present. No oropharyngeal exudate.  Eyes: EOM are normal.  Neck: Normal range of motion. Neck supple.  Cardiovascular: Normal rate.  Pulmonary/Chest: Effort normal. She has no wheezes. She has no rhonchi. She has no rales.  Abdominal: Bowel sounds are normal.  Neurological: She  is alert and oriented to person, place, and time.  Skin: Skin is warm and dry.  Nursing note and vitals reviewed.    ED Treatments / Results  Labs (all labs ordered are listed, but only abnormal results are displayed) Labs Reviewed  RAPID STREP SCREEN (NOT AT [facility])  CULTURE, GROUP A STREP [facility])    EKG  EKG Interpretation None       Radiology No results found.  Procedures Procedures (including critical care time)  Medications Ordered in ED Medications  dexamethasone (DECADRON) injection 10 mg (10 mg Intramuscular Given 08/11/17 0005)  ibuprofen (ADVIL,MOTRIN) tablet 600 mg (600 mg Oral Given 08/11/17 0005)  acetaminophen (TYLENOL) tablet 650 mg (650 mg  Oral Given 08/11/17 0006)     Initial Impression / Assessment and Plan / ED Course  I have reviewed the triage vital signs and the nursing notes.  Pertinent labs & imaging results that were available during my care of the patient were reviewed by me and considered in my medical decision making (see chart for details).  Clinical Course as of Aug 11 45  Mon Aug 11, 2017  0045 Pt reassessed. Pt's VSS and WNL. Pt's cap refill < 3 seconds. Pt has been hydrated in the ER and now passed po challenge. We will discharge with antiemetic. Strict ER return precautions have been discussed and pt will return if he is unable to tolerate fluids and symptoms are getting worse.  Patient wants to go home as soon as possible.  We will discharge her.  Strict ER return precautions have been discussed already.   Since patient's symptoms started more than 48 hours ago, we do not think Tamiflu is going to have any benefit for her, thus tamiflu will not be prescribed.  [AN]    Clinical Course User Index [AN] Derwood Kaplan, MD   DDX includes: Viral syndrome Influenza Pharyngitis - strep Sinusitis Pneumonia  27 year old healthy woman comes in with chief complaint of flulike illness.  Patient's symptoms started about 2 days ago, she started having fevers last night.  Patient is febrile and tachycardic in the ER.  Patient denies any associated nausea/vomiting.  Patient has sore throat, triage ordered rapid strep is negative.  Lungs are clear, there is no indication for x-rays.  We will give some medications for symptom management right now.  Patient likely has influenza, she did not get vaccination this year.  Plan is to discharge patient.  We discussed with her the benefits of proper hydration and symptom management.  We also discussed with her that she is eligible to take Tamiflu since her symptoms started within 48 hours, however informed her that the benefits are minimal. Rx will be provided.   Final  Clinical Impressions(s) / ED Diagnoses   Final diagnoses:  Influenza-like illness  Viral pharyngitis    ED Discharge Orders        Ordered    ibuprofen (ADVIL,MOTRIN) 600 MG tablet  Every 6 hours PRN     08/11/17 0046    acetaminophen (TYLENOL) 325 MG tablet  Every 6 hours PRN     08/11/17 0046       Derwood Kaplan, MD 08/11/17 0005    Derwood Kaplan, MD 08/11/17 [phone]

## 2017-08-11 NOTE — ED Notes (Signed)
Pt was given sprite for PO Challenge

## 2017-08-11 NOTE — Discharge Instructions (Signed)
We think what you have is a viral syndrome - the treatment for which is symptomatic relief only, and your body will fight the infection off in a few days. °We are prescribing you some meds for pain and fevers. °See your primary care doctor in 1 week if the symptoms dont improve. ° °Please return to the ER if your symptoms worsen; you have increased pain, fevers, chills, inability to keep any medications down, confusion. Otherwise see the outpatient doctor as requested. ° °

## 2017-08-11 NOTE — ED Notes (Signed)
Pt understood dc material. NAD noted. Scripts given and follow up info discussed

## 2017-08-13 LAB — CULTURE, GROUP A STREP (THRC)

## 2017-11-02 ENCOUNTER — Other Ambulatory Visit: Payer: Self-pay

## 2017-11-02 DIAGNOSIS — Z79899 Other long term (current) drug therapy: Secondary | ICD-10-CM | POA: Diagnosis not present

## 2017-11-02 DIAGNOSIS — N10 Acute tubulo-interstitial nephritis: Secondary | ICD-10-CM | POA: Insufficient documentation

## 2017-11-02 DIAGNOSIS — F17210 Nicotine dependence, cigarettes, uncomplicated: Secondary | ICD-10-CM | POA: Diagnosis not present

## 2017-11-02 DIAGNOSIS — N189 Chronic kidney disease, unspecified: Secondary | ICD-10-CM | POA: Diagnosis not present

## 2017-11-02 DIAGNOSIS — R109 Unspecified abdominal pain: Secondary | ICD-10-CM | POA: Diagnosis present

## 2017-11-03 ENCOUNTER — Encounter (HOSPITAL_COMMUNITY): Payer: Self-pay | Admitting: Emergency Medicine

## 2017-11-03 ENCOUNTER — Emergency Department (HOSPITAL_COMMUNITY)
Admission: EM | Admit: 2017-11-03 | Discharge: 2017-11-03 | Disposition: A | Discharge status: Home / Self Care | Payer: Medicaid Other | Attending: Emergency Medicine | Admitting: Emergency Medicine

## 2017-11-03 DIAGNOSIS — N12 Tubulo-interstitial nephritis, not specified as acute or chronic: Secondary | ICD-10-CM

## 2017-11-03 LAB — URINALYSIS, ROUTINE W REFLEX MICROSCOPIC
BILIRUBIN URINE: NEGATIVE
GLUCOSE, UA: NEGATIVE mg/dL
KETONES UR: 20 mg/dL — AB
NITRITE: POSITIVE — AB
PH: 6 (ref 5.0–8.0)
Protein, ur: 100 mg/dL — AB
Specific Gravity, Urine: 1.011 (ref 1.005–1.030)

## 2017-11-03 LAB — BASIC METABOLIC PANEL
Anion gap: 9 (ref 5–15)
BUN: 6 mg/dL (ref 6–20)
CO2: 24 mmol/L (ref 22–32)
Calcium: 9.4 mg/dL (ref 8.9–10.3)
Chloride: 106 mmol/L (ref 101–111)
Creatinine, Ser: 0.74 mg/dL (ref 0.44–1.00)
GFR calc Af Amer: >60 mL/min (ref >60)
GFR calc non Af Amer: >60 mL/min (ref >60)
Glucose, Bld: 101 mg/dL — ABNORMAL HIGH (ref 65–99)
Potassium: 3.3 mmol/L — ABNORMAL LOW (ref 3.5–5.1)
Sodium: 139 mmol/L (ref 135–145)

## 2017-11-03 LAB — CBC
HEMATOCRIT: 34.7 % — AB (ref 36.0–46.0)
Hemoglobin: 11.3 g/dL — ABNORMAL LOW (ref 12.0–15.0)
MCH: 30.6 pg (ref 26.0–34.0)
MCHC: 32.6 g/dL (ref 30.0–36.0)
MCV: 94 fL (ref 78.0–100.0)
Platelets: 347 10*3/uL (ref 150–400)
RBC: 3.69 MIL/uL — ABNORMAL LOW (ref 3.87–5.11)
RDW: 14 % (ref 11.5–15.5)
WBC: 7.6 10*3/uL (ref 4.0–10.5)

## 2017-11-03 LAB — I-STAT BETA HCG BLOOD, ED (MC, WL, AP ONLY): I-stat hCG, quantitative: <5 m[IU]/mL (ref <5)

## 2017-11-03 MED ORDER — ONDANSETRON HCL 4 MG/2ML IJ SOLN
4.0000 mg | Freq: Once | INTRAMUSCULAR | Status: AC
  Administered 2017-11-03: 4 mg via INTRAVENOUS
  Filled 2017-11-03: qty 2

## 2017-11-03 MED ORDER — CEPHALEXIN 500 MG PO CAPS: 500.0000 mg | ORAL_CAPSULE | Freq: Four times a day (QID) | ORAL | 0 refills | Status: DC

## 2017-11-03 MED ORDER — IBUPROFEN 600 MG PO TABS: 600.0000 mg | ORAL_TABLET | Freq: Four times a day (QID) | ORAL | 0 refills | Status: DC | PRN

## 2017-11-03 MED ORDER — ACETAMINOPHEN 325 MG PO TABS
650.0000 mg | ORAL_TABLET | Freq: Once | ORAL | Status: AC
Start: 2017-11-03 — End: 2017-11-03
  Administered 2017-11-03: 650 mg via ORAL
  Filled 2017-11-03: qty 2

## 2017-11-03 MED ORDER — KETOROLAC TROMETHAMINE 30 MG/ML IJ SOLN
30.0000 mg | Freq: Once | INTRAMUSCULAR | Status: AC
  Administered 2017-11-03: 30 mg via INTRAVENOUS
  Filled 2017-11-03: qty 1

## 2017-11-03 MED ORDER — SODIUM CHLORIDE 0.9 % IV BOLUS
1000.0000 mL | Freq: Once | INTRAVENOUS | Status: AC
  Administered 2017-11-03: 1000 mL via INTRAVENOUS

## 2017-11-03 MED ORDER — SODIUM CHLORIDE 0.9 % IV SOLN
1.0000 g | Freq: Once | INTRAVENOUS | Status: AC
  Administered 2017-11-03: 1 g via INTRAVENOUS
  Filled 2017-11-03: qty 10

## 2017-11-03 MED ORDER — ACETAMINOPHEN 325 MG PO TABS
325.0000 mg | ORAL_TABLET | Freq: Once | ORAL | Status: AC
Start: 2017-11-03 — End: 2017-11-03
  Administered 2017-11-03: 325 mg via ORAL
  Filled 2017-11-03: qty 1

## 2017-11-03 MED ORDER — PROMETHAZINE HCL 25 MG PO TABS: 25.0000 mg | ORAL_TABLET | Freq: Four times a day (QID) | ORAL | 0 refills | Status: DC | PRN

## 2017-11-03 NOTE — Discharge Instructions (Signed)
Take the antibiotic prescribed to you until finished.  Do not miss any doses or stop this course of treatment early.  Take ibuprofen as prescribed for pain control.  We also recommend 1000 mg Tylenol every 8 hours for persistent fever.  Should you develop worsening symptoms, increased vomiting, fever that does not respond to Tylenol or ibuprofen, or other concerning symptoms -return to the emergency department for continued evaluation.  We recommend follow-up with a primary care doctor by the end of the week for repeat assessment.

## 2017-11-03 NOTE — ED Provider Notes (Signed)
[facility] Provider Note   CSN: [id number] Arrival date & time: 11/02/17  2332     History   Chief Complaint Chief Complaint  Patient presents with  . Flank Pain    HPI Catherine Morales is a 27 y.o. female.  27 year old female presents to the emergency department for evaluation of back pain.  She has had back pain for the past week which has been constant, worsening.  She reports most of her pain localized to the right side.  She notes associated body aches as well as fever and chills.  Fever prior to arrival has been subjective, though she was noted to have a temperature of 102.3 F in triage.  She is concerned that she may have a kidney infection as she has a history of "kidney issues".  No documented chronic kidney disease requiring follow-up with specialist.  Patient with emesis x 1 today.  No medications taken PTA.     Past Medical History:  Diagnosis Date  . Chlamydia   . Chronic kidney disease   . No pertinent past medical history   . Scoliosis     Patient Active Problem List   Diagnosis Date Noted  . 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 Bacterial vaginitis 05/12/2017  . Chronic low back pain 04/21/2015  . Preventative health care 04/05/2015  . Pyelonephritis 04/02/2015  . 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 UNSPECIFIED VAGINITIS AND VULVOVAGINITIS 08/08/2010    History reviewed. No pertinent surgical history.   OB History    Gravida  1   Para  1   Term  1   Preterm      AB      Living  1     SAB      TAB      Ectopic      Multiple      Live Births           Obstetric Comments  Age 27 with first baby         Home Medications    Prior to Admission medications   Medication Sig Start Date End Date Taking? Authorizing Provider  acetaminophen (TYLENOL) 325 MG tablet Take 2 tablets (650 mg total) by mouth every 6 (six) hours as needed. 08/11/17   Derwood KaplanNanavati, Ankit, MD  cephALEXin  (KEFLEX) 500 MG capsule Take 1 capsule (500 mg total) by mouth 4 (four) times daily. 11/03/17   Antony MaduraHumes, Sahand Gosch, PA-C  ibuprofen (ADVIL,MOTRIN) 600 MG tablet Take 1 tablet (600 mg total) by mouth every 6 (six) hours as needed for mild pain or moderate pain. 11/03/17   Antony MaduraHumes, Yomaris Palecek, PA-C  promethazine (PHENERGAN) 25 MG tablet Take 1 tablet (25 mg total) by mouth every 6 (six) hours as needed for nausea or vomiting. 11/03/17   Antony MaduraHumes, Makensie Mulhall, PA-C    Family History Family History  Problem Relation Age of Onset  . Cancer Maternal Grandmother   . Cancer Paternal Grandmother     Social History Social History   Tobacco Use  . Smoking status: Current Every Day Smoker    Packs/day: 0.50    Types: Cigarettes  . Smokeless tobacco: Never Used  . Tobacco comment: 1 CIGARETTES A DAY  Substance Use Topics  . Alcohol use: Yes    Alcohol/week: 0.0 oz    Comment: socially   . Drug use: No     Allergies   Patient has no known allergies.   Review of Systems Review of Systems Ten systems  reviewed and are negative for acute change, except as noted in the HPI.    Physical Exam Updated Vital Signs BP 116/76 (BP Location: Right Arm)   Pulse 73   Temp 98.7 F (37.1 C) (Oral)   Resp 18   Ht 5\' 6"  (1.676 m)   Wt 96.2 kg (212 lb)   LMP 10/29/2017   SpO2 100%   BMI 34.22 kg/m   Physical Exam  Constitutional: She is oriented to person, place, and time. She appears well-developed and well-nourished. No distress.  Nontoxic appearing, in NAD  HENT:  Head: Normocephalic and atraumatic.  Eyes: Conjunctivae and EOM are normal. No scleral icterus.  Neck: Normal range of motion.  Cardiovascular: Normal rate, regular rhythm and intact distal pulses.  Pulmonary/Chest: Effort normal. No stridor. No respiratory distress.  Respirations even and unlabored  Abdominal: Soft.  Soft, obese abdomen. No peritoneal signs.  Musculoskeletal: Normal range of motion.  Neurological: She is alert and oriented to  person, place, and time. She exhibits normal muscle tone. Coordination normal.  Skin: Skin is warm and dry. No rash noted. She is not diaphoretic. No erythema. No pallor.  Psychiatric: She has a normal mood and affect. Her behavior is normal.  Nursing note and vitals reviewed.    ED Treatments / Results  Labs (all labs ordered are listed, but only abnormal results are displayed) Labs Reviewed  URINALYSIS, ROUTINE W REFLEX MICROSCOPIC - Abnormal; Notable for the following components:      Result Value   APPearance CLOUDY (*)    Hgb urine dipstick MODERATE (*)    Ketones, ur 20 (*)    Protein, ur 100 (*)    Nitrite POSITIVE (*)    Leukocytes, UA LARGE (*)    Bacteria, UA RARE (*)    Squamous Epithelial / LPF 6-30 (*)    Non Squamous Epithelial 0-5 (*)    All other components within normal limits  CBC - Abnormal; Notable for the following components:   RBC 3.69 (*)    Hemoglobin 11.3 (*)    HCT 34.7 (*)    All other components within normal limits  BASIC METABOLIC PANEL - Abnormal; Notable for the following components:   Potassium 3.3 (*)    Glucose, Bld 101 (*)    All other components within normal limits  URINE CULTURE  I-STAT BETA HCG BLOOD, ED ([facility], [facility], [facility] ONLY)    EKG None  Radiology No results found.  Procedures Procedures (including critical care time)  Medications Ordered in ED Medications  acetaminophen (TYLENOL) tablet 650 mg (650 mg Oral Given 11/03/17 0033)  sodium chloride 0.9 % bolus 1,000 mL (0 mLs Intravenous Stopped 11/03/17 0411)  cefTRIAXone (ROCEPHIN) 1 g in sodium chloride 0.9 % 100 mL IVPB (0 g Intravenous Stopped 11/03/17 0412)  ondansetron (ZOFRAN) injection 4 mg (4 mg Intravenous Given 11/03/17 0343)  ketorolac (TORADOL) 30 MG/ML injection 30 mg (30 mg Intravenous Given 11/03/17 0342)  acetaminophen (TYLENOL) tablet 325 mg (325 mg Oral Given 11/03/17 0342)    5:00 AM Patient reassessed.  She is resting comfortably.  Vitals stable.  She has had a  sandwich as well as multiple beverages to drink without additional nausea or vomiting.   Initial Impression / Assessment and Plan / ED Course  I have reviewed the triage vital signs and the nursing notes.  Pertinent labs & imaging results that were available during my care of the patient were reviewed by me and considered in my medical decision making (  see chart for details).     27 year old female presents to the emergency department for evaluation of flank pain with fevers, chills, body aches.  Symptoms have been worsening over the past week, more so over the past 2 days.  Patient febrile on arrival to 102.3 F.  This has improved following antipyretics.  No tachycardia or hypotension.  Patient without leukocytosis today.  Fever and flank pain consistent with pyelonephritis.  Urinalysis with too numerous to count white blood cells and positive nitrites.  Red blood cells favored to represent hemorrhagic cystitis.  No hx of kidney stones and patient not currently menstruating.  The patient has been managed in the emergency department with Toradol, IV fluids, IV Rocephin.  Patient reports improvement in symptoms on repeat assessment.  Plan for continued outpatient management with Keflex, ibuprofen, Phenergan PRN.  Return precautions discussed and provided. Patient discharged in stable condition with no unaddressed concerns.   Vitals:   11/03/17 0400 11/03/17 0415 11/03/17 0430 11/03/17 0524  BP: 125/71 133/74 118/67 116/76  Pulse: 72 71 67 73  Resp:    18  Temp:    98.7 F (37.1 C)  TempSrc:    Oral  SpO2: 96% 100% 97% 100%  Weight:      Height:        Final Clinical Impressions(s) / ED Diagnoses   Final diagnoses:  Pyelonephritis    ED Discharge Orders        Ordered    cephALEXin (KEFLEX) 500 MG capsule  4 times daily     11/03/17 0512    ibuprofen (ADVIL,MOTRIN) 600 MG tablet  Every 6 hours PRN     11/03/17 0512    promethazine (PHENERGAN) 25 MG tablet  Every 6 hours PRN      11/03/17 0512       Antony Madura, PA-C 11/03/17 0536    Ward, Layla Maw, DO 11/03/17 [phone]

## 2017-11-03 NOTE — ED Triage Notes (Signed)
Pt c/o bilat flank pain, R side worse x 1 week. Pt reports fever, chills, body aches intermittent x 2 days. Emesis today x 1

## 2017-11-05 LAB — URINE CULTURE

## 2017-11-06 ENCOUNTER — Telehealth: Payer: Self-pay | Admitting: Emergency Medicine

## 2017-11-06 NOTE — Telephone Encounter (Signed)
Post ED Visit - Positive Culture Follow-up  Culture report reviewed by antimicrobial stewardship pharmacist:  []  Enzo BiNathan Batchelder, Pharm.D. []  Celedonio MiyamotoJeremy Frens, Pharm.D., BCPS AQ-ID []  Garvin FilaMike Maccia, Pharm.D., BCPS []  Georgina PillionElizabeth Martin, Pharm.D., BCPS []  Clifton HeightsMinh Pham, VermontPharm.D., BCPS, AAHIVP []  Estella HuskMichelle Turner, Pharm.D., BCPS, AAHIVP []  Lysle Pearlachel Rumbarger, PharmD, BCPS []  Blake DivineShannon Parkey, PharmD []  Pollyann SamplesAndy Johnston, PharmD, BCPS Sharin MonsEmily Sinclair PharmD  Positive urine culture Treated with cephalexin, organism sensitive to the same and no further patient follow-up is required at this time.  Berle MullMiller, Hondo Nanda 11/06/2017, 10:59 AM

## 2017-11-24 ENCOUNTER — Ambulatory Visit (INDEPENDENT_AMBULATORY_CARE_PROVIDER_SITE_OTHER): Payer: Medicaid Other

## 2017-11-24 DIAGNOSIS — Z111 Encounter for screening for respiratory tuberculosis: Secondary | ICD-10-CM | POA: Diagnosis present

## 2017-11-24 NOTE — Progress Notes (Signed)
   Tuberculin skin test applied to right ventral forearm. Appt made for 11/26/17 at 3 pm to have read. Ples Specter, RN [facility] RN)

## 2017-11-26 ENCOUNTER — Ambulatory Visit (INDEPENDENT_AMBULATORY_CARE_PROVIDER_SITE_OTHER): Payer: Medicaid Other

## 2017-11-26 DIAGNOSIS — Z111 Encounter for screening for respiratory tuberculosis: Secondary | ICD-10-CM

## 2017-11-26 LAB — TB SKIN TEST
Induration: 0 mm
TB SKIN TEST: NEGATIVE

## 2017-11-26 NOTE — Progress Notes (Signed)
   Patient here today to have PPD site read.   PPD read and results entered in Epic. Result: 0 mm induration. Interpretation: Negative Letter given for employer and copy faxed to [phone]. Ples Specter, RN [facility] RN)

## 2018-01-17 ENCOUNTER — Encounter: Payer: Self-pay | Admitting: *Deleted

## 2018-01-29 ENCOUNTER — Encounter: Payer: Self-pay | Admitting: Internal Medicine

## 2018-01-29 ENCOUNTER — Other Ambulatory Visit (HOSPITAL_COMMUNITY)
Admission: RE | Admit: 2018-01-29 | Discharge: 2018-01-29 | Disposition: A | Discharge status: Home / Self Care | Payer: Medicaid Other | Source: Ambulatory Visit | Attending: Internal Medicine | Admitting: Internal Medicine

## 2018-01-29 ENCOUNTER — Encounter (INDEPENDENT_AMBULATORY_CARE_PROVIDER_SITE_OTHER): Payer: Self-pay

## 2018-01-29 ENCOUNTER — Ambulatory Visit: Payer: Medicaid Other | Admitting: Internal Medicine

## 2018-01-29 VITALS — BP 119/74 | HR 81 | Temp 98.2°F | Wt 220.0 lb

## 2018-01-29 DIAGNOSIS — Z6835 Body mass index (BMI) 35.0-35.9, adult: Secondary | ICD-10-CM

## 2018-01-29 DIAGNOSIS — Z842 Family history of other diseases of the genitourinary system: Secondary | ICD-10-CM

## 2018-01-29 DIAGNOSIS — Z8619 Personal history of other infectious and parasitic diseases: Secondary | ICD-10-CM | POA: Diagnosis not present

## 2018-01-29 DIAGNOSIS — R87610 Atypical squamous cells of undetermined significance on cytologic smear of cervix (ASC-US): Secondary | ICD-10-CM

## 2018-01-29 DIAGNOSIS — E663 Overweight: Secondary | ICD-10-CM | POA: Diagnosis not present

## 2018-01-29 DIAGNOSIS — R87810 Cervical high risk human papillomavirus (HPV) DNA test positive: Secondary | ICD-10-CM | POA: Insufficient documentation

## 2018-01-29 DIAGNOSIS — Z0000 Encounter for general adult medical examination without abnormal findings: Secondary | ICD-10-CM | POA: Diagnosis not present

## 2018-01-29 DIAGNOSIS — Z975 Presence of (intrauterine) contraceptive device: Secondary | ICD-10-CM

## 2018-01-29 DIAGNOSIS — Z01419 Encounter for gynecological examination (general) (routine) without abnormal findings: Secondary | ICD-10-CM | POA: Diagnosis not present

## 2018-01-29 DIAGNOSIS — B9689 Other specified bacterial agents as the cause of diseases classified elsewhere: Secondary | ICD-10-CM | POA: Diagnosis not present

## 2018-01-29 DIAGNOSIS — Z87448 Personal history of other diseases of urinary system: Secondary | ICD-10-CM | POA: Diagnosis not present

## 2018-01-29 DIAGNOSIS — N760 Acute vaginitis: Secondary | ICD-10-CM | POA: Insufficient documentation

## 2018-01-29 DIAGNOSIS — N939 Abnormal uterine and vaginal bleeding, unspecified: Secondary | ICD-10-CM

## 2018-01-29 HISTORY — DX: Abnormal uterine and vaginal bleeding, unspecified: N93.9

## 2018-01-29 LAB — POCT URINE PREGNANCY: Preg Test, Ur: NEGATIVE

## 2018-01-29 NOTE — Assessment & Plan Note (Addendum)
Last pap smear 2018 while in prison with "HPV cells." Difficult to obtain those records. Also history of STIs.  Plan: - Pap smear: ASCUS and HPV+ referral placed to OBGYN for possible colposcopy. Results and referral placement communicated with patient.  - Wet prep: + bacterial vaginosis, patient complains of smelly white discharge. Treat with metronidazole

## 2018-01-29 NOTE — Assessment & Plan Note (Addendum)
Overweight female who presents with a couple month history of abnormal uterine bleeding between cycles. Also notes pain with sex, hirsutism, and family history of PCOS and fibroids. Prior blood work shows mild anemia, patient endorses PICA (eating ice). Pelvic exam showed small amount of pooled blood in vaginal vault. Excess vaginal tissue made it difficult to visualize the cervix.   Plan: - total testosterone  - cbc  - urine pregnancy test - transvaginal ultrasound  - discussed birth control options but will defer until AUB workup is complete - Pending CBC today, consider iron supplementation

## 2018-01-29 NOTE — Patient Instructions (Addendum)
It was nice seeing you today. Thank you for choosing [facility] for your Primary Care.   Today we talked about:  1) Abnormal vaginal bleeding: I will call you with the results of your pap smear and infection swab. You should get a call about scheduling a transvaginal ultrasound to see if you have any fibroids or any other reason for abnormal vaginal bleeding. I will call you with the results and we can schedule a follow up visit at that time.    Please contact the clinic if you have any problems, or need to be seen sooner.

## 2018-01-29 NOTE — Progress Notes (Signed)
   CC: abnormal uterine bleeding   HPI:  Catherine Morales is a 27 y.o. G1P1 female with h/o STIs (chlamydia, BV), pyelonephritis and AUB who presents for evaluation of abnormal uterine bleeding between cycles. Normal menstrual cycles occur monthly, last 2-3 days, LMP 7/4. Nexplanon in 2010. New onset bleeding between cycles over the last couple months with associated pelvic pain, and pain with sex. Last pap smear 2018 while in prison showed "HPV cells."   Denies non-bloody vaginal discharge, odor, or itchiness. Denies recent fever, chills.    Past Medical History:  Diagnosis Date  . Chlamydia   . Chronic kidney disease   . No pertinent past medical history   . Scoliosis     Physical Exam:  Vitals:   01/29/18 1334  BP: 119/74  Pulse: 81  Temp: 98.2 F (36.8 C)  TempSrc: Oral  SpO2: 99%  Weight: 220 lb (99.8 kg)   Gen: Well- appearing, overweight female with excess facial hair Abd: soft, nontender GU: small amount of pooled blood in vaginal vault. No cervical motion tenderness or friability. Cervical os hard to visualize due to excess vaginal mucosa.     Assessment & Plan:   See Encounters Tab for problem based charting.  Patient seen with Dr. Criselda PeachesMullen

## 2018-01-30 LAB — CBC WITH DIFFERENTIAL/PLATELET
BASOS: 0 %
Basophils Absolute: 0 10*3/uL (ref 0.0–0.2)
EOS (ABSOLUTE): 0.1 10*3/uL (ref 0.0–0.4)
EOS: 1 %
HEMATOCRIT: 34.4 % (ref 34.0–46.6)
Hemoglobin: 11.3 g/dL (ref 11.1–15.9)
Immature Grans (Abs): 0 10*3/uL (ref 0.0–0.1)
Immature Granulocytes: 0 %
Lymphocytes Absolute: 2.9 10*3/uL (ref 0.7–3.1)
Lymphs: 48 %
MCH: 29.7 pg (ref 26.6–33.0)
MCHC: 32.8 g/dL (ref 31.5–35.7)
MCV: 91 fL (ref 79–97)
MONOS ABS: 0.5 10*3/uL (ref 0.1–0.9)
Monocytes: 8 %
Neutrophils Absolute: 2.6 10*3/uL (ref 1.4–7.0)
Neutrophils: 43 %
Platelets: 408 10*3/uL (ref 150–450)
RBC: 3.8 x10E6/uL (ref 3.77–5.28)
RDW: 13.8 % (ref 12.3–15.4)
WBC: 6.1 10*3/uL (ref 3.4–10.8)

## 2018-01-30 LAB — CERVICOVAGINAL ANCILLARY ONLY
Bacterial vaginitis: POSITIVE — AB
CANDIDA VAGINITIS: NEGATIVE
Chlamydia: NEGATIVE
Neisseria Gonorrhea: NEGATIVE
TRICH (WINDOWPATH): NEGATIVE

## 2018-01-30 LAB — TESTOSTERONE: Testosterone: 30 ng/dL (ref 8–48)

## 2018-01-30 NOTE — Progress Notes (Signed)
[facility] Clinic Attending  I saw and evaluated the patient.  I personally confirmed the key portions of the history and exam documented by Dr. Vogel and I reviewed pertinent patient test results.  The assessment, diagnosis, and plan were formulated together and I agree with the documentation in the resident's note.  

## 2018-02-02 ENCOUNTER — Telehealth: Payer: Self-pay | Admitting: Internal Medicine

## 2018-02-02 DIAGNOSIS — B9689 Other specified bacterial agents as the cause of diseases classified elsewhere: Secondary | ICD-10-CM

## 2018-02-02 DIAGNOSIS — N760 Acute vaginitis: Principal | ICD-10-CM

## 2018-02-02 LAB — CYTOLOGY - PAP
ADEQUACY: ABSENT — AB
Diagnosis: UNDETERMINED — AB
HPV: DETECTED — AB

## 2018-02-02 MED ORDER — METRONIDAZOLE 500 MG PO TABS: 500.0000 mg | ORAL_TABLET | Freq: Two times a day (BID) | ORAL | 0 refills | Status: DC

## 2018-02-02 MED ORDER — FLUCONAZOLE 150 MG PO TABS
150.0000 mg | ORAL_TABLET | Freq: Every day | ORAL | 0 refills | Status: DC
Start: 2018-02-02 — End: 2019-04-20

## 2018-02-02 NOTE — Telephone Encounter (Signed)
Pt requesting medication for a Yeast Infection and her Vaginal Bacterial Infection.  Pt unsure of what the name of either medication is.  Pt also requesting a phone call back.

## 2018-02-03 ENCOUNTER — Telehealth: Payer: Self-pay | Admitting: *Deleted

## 2018-02-03 DIAGNOSIS — B9689 Other specified bacterial agents as the cause of diseases classified elsewhere: Secondary | ICD-10-CM

## 2018-02-03 DIAGNOSIS — N760 Acute vaginitis: Principal | ICD-10-CM

## 2018-02-03 NOTE — Addendum Note (Signed)
Addended by: Maura CrandallGOLDSTON, DARLENE C on: 02/03/2018 02:03 PM   Modules accepted: Orders

## 2018-02-03 NOTE — Telephone Encounter (Signed)
Received fax from [facility] requesting Rx for metronidazole written by PCP on 02/02/2018 be resent by attending as PCP not on file with medicaid. Kinnie FeilL. Ducatte, RN, BSN

## 2018-02-03 NOTE — Addendum Note (Signed)
Addended by: Ali LoweVOGEL, MARIE S on: 02/03/2018 04:10 PM   Modules accepted: Orders

## 2018-02-04 MED ORDER — METRONIDAZOLE 500 MG PO TABS: 500.0000 mg | ORAL_TABLET | Freq: Two times a day (BID) | ORAL | 0 refills | Status: AC

## 2018-02-04 MED ORDER — FLUCONAZOLE 150 MG PO TABS: 150.0000 mg | ORAL_TABLET | Freq: Once | ORAL | 0 refills | Status: AC

## 2018-02-04 NOTE — Telephone Encounter (Signed)
Ok. It looks like Dr. Consuello BossierVogel's [facility] for flagyl [facility]. I will re-send it now.

## 2018-02-06 ENCOUNTER — Telehealth: Payer: Self-pay | Admitting: Internal Medicine

## 2018-02-18 ENCOUNTER — Telehealth: Payer: Self-pay | Admitting: *Deleted

## 2018-02-18 NOTE — Telephone Encounter (Signed)
SPOKE WITH [facility]. UNABLE TO SCHEDULE PATIENT IN THE MONTH OF AUGUST/ SCHEDULE IS FULL. TO BE SCHEDULED SOMETIME IN THE September.

## 2018-02-24 ENCOUNTER — Ambulatory Visit (HOSPITAL_COMMUNITY): Payer: Medicaid Other

## 2018-03-11 ENCOUNTER — Encounter: Payer: Self-pay | Admitting: Obstetrics & Gynecology

## 2018-03-17 ENCOUNTER — Encounter

## 2018-04-08 ENCOUNTER — Ambulatory Visit: Payer: Medicaid Other | Admitting: Obstetrics & Gynecology

## 2018-04-08 ENCOUNTER — Encounter: Payer: Self-pay | Admitting: Obstetrics & Gynecology

## 2018-04-08 NOTE — Addendum Note (Signed)
Addended by: Neomia DearPOWERS, Ersilia Brawley E on: 04/08/2018 06:11 PM   Modules accepted: Orders

## 2018-04-13 ENCOUNTER — Other Ambulatory Visit: Payer: Self-pay

## 2018-04-13 ENCOUNTER — Encounter (HOSPITAL_COMMUNITY): Payer: Self-pay

## 2018-04-13 ENCOUNTER — Inpatient Hospital Stay (HOSPITAL_COMMUNITY)
Admission: AD | Admit: 2018-04-13 | Discharge: 2018-04-13 | Disposition: A | Discharge status: Home / Self Care | Payer: Medicaid Other | Source: Ambulatory Visit | Attending: Obstetrics and Gynecology | Admitting: Obstetrics and Gynecology

## 2018-04-13 DIAGNOSIS — B9689 Other specified bacterial agents as the cause of diseases classified elsewhere: Secondary | ICD-10-CM

## 2018-04-13 DIAGNOSIS — N760 Acute vaginitis: Secondary | ICD-10-CM | POA: Diagnosis not present

## 2018-04-13 DIAGNOSIS — F17210 Nicotine dependence, cigarettes, uncomplicated: Secondary | ICD-10-CM | POA: Insufficient documentation

## 2018-04-13 DIAGNOSIS — N939 Abnormal uterine and vaginal bleeding, unspecified: Secondary | ICD-10-CM | POA: Diagnosis not present

## 2018-04-13 DIAGNOSIS — Z711 Person with feared health complaint in whom no diagnosis is made: Secondary | ICD-10-CM

## 2018-04-13 HISTORY — DX: Papillomavirus as the cause of diseases classified elsewhere: B97.7

## 2018-04-13 HISTORY — DX: Other specified bacterial agents as the cause of diseases classified elsewhere: B96.89

## 2018-04-13 HISTORY — DX: Acute vaginitis: N76.0

## 2018-04-13 LAB — WET PREP, GENITAL
SPERM: NONE SEEN
Trich, Wet Prep: NONE SEEN
Yeast Wet Prep HPF POC: NONE SEEN

## 2018-04-13 LAB — URINALYSIS, ROUTINE W REFLEX MICROSCOPIC
BILIRUBIN URINE: NEGATIVE
GLUCOSE, UA: NEGATIVE mg/dL
Ketones, ur: NEGATIVE mg/dL
NITRITE: NEGATIVE
PH: 5 (ref 5.0–8.0)
Protein, ur: NEGATIVE mg/dL
RBC / HPF: >50 RBC/hpf — ABNORMAL HIGH (ref 0–5)
SPECIFIC GRAVITY, URINE: 1.027 (ref 1.005–1.030)

## 2018-04-13 LAB — POCT PREGNANCY, URINE: Preg Test, Ur: NEGATIVE

## 2018-04-13 MED ORDER — METRONIDAZOLE 500 MG PO TABS: 500.0000 mg | ORAL_TABLET | Freq: Two times a day (BID) | ORAL | 0 refills | Status: DC

## 2018-04-13 MED ORDER — CEFTRIAXONE SODIUM 250 MG IJ SOLR
250.0000 mg | Freq: Once | INTRAMUSCULAR | Status: AC
  Administered 2018-04-13: 250 mg via INTRAMUSCULAR
  Filled 2018-04-13: qty 250

## 2018-04-13 MED ORDER — FLUCONAZOLE 150 MG PO TABS: 150.0000 mg | ORAL_TABLET | Freq: Once | ORAL | 0 refills | Status: AC

## 2018-04-13 MED ORDER — AZITHROMYCIN 250 MG PO TABS
1000.0000 mg | ORAL_TABLET | Freq: Once | ORAL | Status: AC
  Administered 2018-04-13: 1000 mg via ORAL
  Filled 2018-04-13: qty 4

## 2018-04-13 NOTE — MAU Provider Note (Signed)
History     CSN: [id number]  Arrival date and time: 04/13/18 1438   First Provider Initiated Contact with Patient 04/13/18 1701      Chief Complaint  Patient presents with  . Vaginal Bleeding  . Abdominal Pain   HPI   Catherine Morales is a 28 y.o. female non pregnant here in MAU with vaginal discharge, vaginal odor, and spotting. Says the symptoms have been present for 1 week. Says she does not trust her partner and thinks he may have given her an STI. Says the discharge is thick, and odorous. She has not tried anything over the counter for the symptoms. She denies pain at this time.  Says she is spotting however is not on her menstrual cycle at this time. The spotting is constant and is pink/bloody.   OB History    Gravida  1   Para  1   Term  1   Preterm      AB      Living  1     SAB      TAB      Ectopic      Multiple      Live Births           Obstetric Comments  Age 75 with first baby        Past Medical History:  Diagnosis Date  . BV (bacterial vaginosis)   . Chlamydia   . Chronic kidney disease   . HPV in female   . No pertinent past medical history   . Scoliosis     Past Surgical History:  Procedure Laterality Date  . WISDOM TOOTH EXTRACTION      Family History  Problem Relation Age of Onset  . Cancer Maternal Grandmother   . Cancer Paternal Grandmother     Social History   Tobacco Use  . Smoking status: Current Every Day Smoker    Packs/day: 0.50    Types: Cigarettes  . Smokeless tobacco: Never Used  . Tobacco comment: 1 CIGARETTES A DAY  Substance Use Topics  . Alcohol use: Yes    Alcohol/week: 0.0 standard drinks    Comment: socially   . Drug use: No    Allergies: No Known Allergies  Medications Prior to Admission  Medication Sig Dispense Refill Last Dose  . acetaminophen (TYLENOL) 325 MG tablet Take 2 tablets (650 mg total) by mouth every 6 (six) hours as needed. 30 tablet 0   . fluconazole (DIFLUCAN) 150 MG  tablet Take 1 tablet (150 mg total) by mouth daily. If you develop a yeast infection (vaginal itching and white cottage cheese like discharge) 1 tablet 0   . ibuprofen (ADVIL,MOTRIN) 600 MG tablet Take 1 tablet (600 mg total) by mouth every 6 (six) hours as needed for mild pain or moderate pain. 30 tablet 0    Results for orders placed or performed during the hospital encounter of 04/13/18 (from the past 48 hour(s))  Urinalysis, Routine w reflex microscopic     Status: Abnormal   Collection Time: 04/13/18  3:40 PM  Result Value Ref Range   Color, Urine YELLOW YELLOW   APPearance CLOUDY (A) CLEAR   Specific Gravity, Urine 1.027 1.005 - 1.030   pH 5.0 5.0 - 8.0   Glucose, UA NEGATIVE NEGATIVE mg/dL   Hgb urine dipstick LARGE (A) NEGATIVE   Bilirubin Urine NEGATIVE NEGATIVE   Ketones, ur NEGATIVE NEGATIVE mg/dL   Protein, ur NEGATIVE NEGATIVE mg/dL   Nitrite NEGATIVE  NEGATIVE   Leukocytes, UA SMALL (A) NEGATIVE   RBC / HPF >50 (H) 0 - 5 RBC/hpf   WBC, UA 11-20 0 - 5 WBC/hpf   Bacteria, UA RARE (A) NONE SEEN   Squamous Epithelial / LPF 11-20 0 - 5   Mucus PRESENT     Comment: Performed at [facility], [address] [place], [place] [address]  Pregnancy, urine POC     Status: None   Collection Time: 04/13/18  3:49 PM  Result Value Ref Range   Preg Test, Ur NEGATIVE NEGATIVE    Comment:        THE SENSITIVITY OF THIS METHODOLOGY IS >24 mIU/mL   Wet prep, genital     Status: Abnormal   Collection Time: 04/13/18  4:32 PM  Result Value Ref Range   Yeast Wet Prep HPF POC NONE SEEN NONE SEEN   Trich, Wet Prep NONE SEEN NONE SEEN   Clue Cells Wet Prep HPF POC PRESENT (A) NONE SEEN   WBC, Wet Prep HPF POC MODERATE (A) NONE SEEN   Sperm NONE SEEN     Comment: Performed at [facility], [address] [place], [place] [address]   Review of Systems  Constitutional: Negative for fever.  Gastrointestinal: Negative for abdominal pain, nausea and vomiting.  Genitourinary:  Positive for frequency, vaginal bleeding and vaginal discharge. Negative for dysuria.   Physical Exam   Blood pressure 123/64, pulse 80, temperature 98.4 F (36.9 C), temperature source Oral, resp. rate 16, weight 99.3 kg, last menstrual period 03/15/2018, SpO2 100 %.  Physical Exam  Constitutional: She is oriented to person, place, and time. She appears well-developed and well-nourished. No distress.  HENT:  Head: Normocephalic.  GI: Soft. She exhibits no distension. There is no tenderness. There is no rebound.  Genitourinary:  Genitourinary Comments: Wet prep and GC collected without speculum Bimanual exam: no CMT, no adnexal tenderness.   Musculoskeletal: Normal range of motion.  Neurological: She is alert and oriented to person, place, and time.  Skin: Skin is warm. She is not diaphoretic.  Psychiatric: Her behavior is normal.   MAU Course  Procedures  None  MDM  GC and wet prep collected RPR and HIV collected Rocephin 250 mg IM, Azithromycin 1000 mg PO in MAU   Assessment and Plan   A:  1. Concern about STD in female without diagnosis   2. Bacterial vaginitis   3. Abnormal vaginal bleeding     P:  Discharge home in stable condition Rx: Flagyl- no alcohol  Use condoms always Return to MAU for emergencies only  Establish care with GYN Partner needs treatment   Catherine Morales, Harolyn RutherfordJennifer I, NP 04/13/2018 5:48 PM

## 2018-04-13 NOTE — MAU Note (Addendum)
Having this blood, not period blood.  Having little pains in her stomach, smells bad.  Blood is light, but when she wipes there is this other dark stuff. Had for about a wk. Frequency, urgency with urination,no pain.  "Hope it is not an STD, has had unprotected sex and he cheats"

## 2018-04-13 NOTE — Discharge Instructions (Signed)
Sexually Transmitted Disease A sexually transmitted disease (STD) is a disease or infection that may be passed (transmitted) from person to person, usually during sexual activity. This may happen by way of saliva, semen, blood, vaginal mucus, or urine. Common STDs include:  Gonorrhea.  Chlamydia.  Syphilis.  HIV and AIDS.  Genital herpes.  Hepatitis B and C.  Trichomonas.  Human papillomavirus (HPV).  Pubic lice.  Scabies.  Mites.  Bacterial vaginosis.  What are the causes? An STD may be caused by bacteria, a virus, or parasites. STDs are often transmitted during sexual activity if one person is infected. However, they may also be transmitted through nonsexual means. STDs may be transmitted after:  Sexual intercourse with an infected person.  Sharing sex toys with an infected person.  Sharing needles with an infected person or using unclean piercing or tattoo needles.  Having intimate contact with the genitals, mouth, or rectal areas of an infected person.  Exposure to infected fluids during birth.  What are the signs or symptoms? Different STDs have different symptoms. Some people may not have any symptoms. If symptoms are present, they may include:  Painful or bloody urination.  Pain in the pelvis, abdomen, vagina, anus, throat, or eyes.  A skin rash, itching, or irritation.  Growths, ulcerations, blisters, or sores in the genital and anal areas.  Abnormal vaginal discharge with or without bad odor.  Penile discharge in men.  Fever.  Pain or bleeding during sexual intercourse.  Swollen glands in the groin area.  Yellow skin and eyes (jaundice). This is seen with hepatitis.  Swollen testicles.  Infertility.  Sores and blisters in the mouth.  How is this diagnosed? To make a diagnosis, your health care provider may:  Take a medical history.  Perform a physical exam.  Take a sample of any discharge to examine.  Swab the throat, cervix,  opening to the penis, rectum, or vagina for testing.  Test a sample of your first morning urine.  Perform blood tests.  Perform a Pap test, if this applies.  Perform a colposcopy.  Perform a laparoscopy.  How is this treated? Treatment depends on the STD. Some STDs may be treated but not cured.  Chlamydia, gonorrhea, trichomonas, and syphilis can be cured with antibiotic medicine.  Genital herpes, hepatitis, and HIV can be treated, but not cured, with prescribed medicines. The medicines lessen symptoms.  Genital warts from HPV can be treated with medicine or by freezing, burning (electrocautery), or surgery. Warts may come back.  HPV cannot be cured with medicine or surgery. However, abnormal areas may be removed from the cervix, vagina, or vulva.  If your diagnosis is confirmed, your recent sexual partners need treatment. This is true even if they are symptom-free or have a negative culture or evaluation. They should not have sex until their health care providers say it is okay.  Your health care provider may test you for infection again 3 months after treatment.  How is this prevented? Take these steps to reduce your risk of getting an STD:  Use latex condoms, dental dams, and water-soluble lubricants during sexual activity. Do not use petroleum jelly or oils.  Avoid having multiple sex partners.  Do not have sex with someone who has other sex partners.  Do not have sex with anyone you do not know or who is at high risk for an STD.  Avoid risky sex practices that can break your skin.  Do not have sex if you have open sores  on your mouth or skin.  Avoid drinking too much alcohol or taking illegal drugs. Alcohol and drugs can affect your judgment and put you in a vulnerable position.  Avoid engaging in oral and anal sex acts.  Get vaccinated for HPV and hepatitis. If you have not received these vaccines in the past, talk to your health care provider about whether one or  both might be right for you.  If you are at risk of being infected with HIV, it is recommended that you take a prescription medicine daily to prevent HIV infection. This is called pre-exposure prophylaxis (PrEP). You are considered at risk if: ? You are a man who has sex with other men (MSM). ? You are a heterosexual man or woman and are sexually active with more than one partner. ? You take drugs by injection. ? You are sexually active with a partner who has HIV.  Talk with your health care provider about whether you are at high risk of being infected with HIV. If you choose to begin PrEP, you should first be tested for HIV. You should then be tested every 3 months for as long as you are taking PrEP.  Contact a health care provider if:  See your health care provider.  Tell your sexual partner(s). They should be tested and treated for any STDs.  Do not have sex until your health care provider says it is okay. Get help right away if: Contact your health care provider right away if:  You have severe abdominal pain.  You are a man and notice swelling or pain in your testicles.  You are a woman and notice swelling or pain in your vagina.  This information is not intended to replace advice given to you by your health care provider. Make sure you discuss any questions you have with your health care provider. Document Released: 09/28/2002 Document Revised: 01/26/2016 Document Reviewed: 01/26/2013 Elsevier Interactive Patient Education  2018 [employer] Bacterial Vaginosis Bacterial vaginosis is an infection of the vagina. It happens when too many germs (bacteria) grow in the vagina. This infection puts you at risk for infections from sex (STIs). Treating this infection can lower your risk for some STIs. You should also treat this if you are pregnant. It can cause your baby to be born early. Follow these instructions at home: Medicines  Take over-the-counter and prescription medicines  only as told by your doctor.  Take or use your antibiotic medicine as told by your doctor. Do not stop taking or using it even if you start to feel better. General instructions  If you your sexual partner is a woman, tell her that you have this infection. She needs to get treatment if she has symptoms. If you have a female partner, he does not need to be treated.  During treatment: ? Avoid sex. ? Do not douche. ? Avoid alcohol as told. ? Avoid breastfeeding as told.  Drink enough fluid to keep your pee (urine) clear or pale yellow.  Keep your vagina and butt (rectum) clean. ? Wash the area with warm water every day. ? Wipe from front to back after you use the toilet.  Keep all follow-up visits as told by your doctor. This is important. Preventing this condition  Do not douche.  Use only warm water to wash around your vagina.  Use protection when you have sex. This includes: ? Latex condoms. ? Dental dams.  Limit how many people you have sex with. It is best to  only have sex with the same person (be monogamous).  Get tested for STIs. Have your partner get tested.  Wear underwear that is cotton or lined with cotton.  Avoid tight pants and pantyhose. This is most important in summer.  Do not use any products that have nicotine or tobacco in them. These include cigarettes and e-cigarettes. If you need help quitting, ask your doctor.  Do not use illegal drugs.  Limit how much alcohol you drink. Contact a doctor if:  Your symptoms do not get better, even after you are treated.  You have more discharge or pain when you pee (urinate).  You have a fever.  You have pain in your belly (abdomen).  You have pain with sex.  Your bleed from your vagina between periods. Summary  This infection happens when too many germs (bacteria) grow in the vagina.  Treating this condition can lower your risk for some infections from sex (STIs).  You should also treat this if you are  pregnant. It can cause early (premature) birth.  Do not stop taking or using your antibiotic medicine even if you start to feel better. This information is not intended to replace advice given to you by your health care provider. Make sure you discuss any questions you have with your health care provider. Document Released: 04/16/2008 Document Revised: 03/23/2016 Document Reviewed: 03/23/2016 Elsevier Interactive Patient Education  2017 [employer].

## 2018-04-14 LAB — RPR: RPR Ser Ql: NONREACTIVE

## 2018-04-14 LAB — HIV ANTIBODY (ROUTINE TESTING W REFLEX): HIV Screen 4th Generation wRfx: NONREACTIVE

## 2018-04-14 LAB — GC/CHLAMYDIA PROBE AMP (~~LOC~~) NOT AT ARMC
CHLAMYDIA, DNA PROBE: NEGATIVE
Neisseria Gonorrhea: NEGATIVE

## 2019-01-09 ENCOUNTER — Encounter: Payer: Self-pay | Admitting: *Deleted

## 2019-01-18 ENCOUNTER — Other Ambulatory Visit: Payer: Self-pay

## 2019-01-18 ENCOUNTER — Emergency Department (HOSPITAL_COMMUNITY)
Admission: EM | Admit: 2019-01-18 | Discharge: 2019-01-19 | Disposition: A | Discharge status: Home / Self Care | Payer: Medicaid Other | Attending: Emergency Medicine | Admitting: Emergency Medicine

## 2019-01-18 ENCOUNTER — Encounter (HOSPITAL_COMMUNITY): Payer: Self-pay

## 2019-01-18 DIAGNOSIS — B3731 Acute candidiasis of vulva and vagina: Secondary | ICD-10-CM

## 2019-01-18 DIAGNOSIS — F17210 Nicotine dependence, cigarettes, uncomplicated: Secondary | ICD-10-CM | POA: Diagnosis not present

## 2019-01-18 DIAGNOSIS — B373 Candidiasis of vulva and vagina: Secondary | ICD-10-CM | POA: Insufficient documentation

## 2019-01-18 DIAGNOSIS — N760 Acute vaginitis: Secondary | ICD-10-CM | POA: Insufficient documentation

## 2019-01-18 DIAGNOSIS — L299 Pruritus, unspecified: Secondary | ICD-10-CM | POA: Insufficient documentation

## 2019-01-18 DIAGNOSIS — N898 Other specified noninflammatory disorders of vagina: Secondary | ICD-10-CM | POA: Diagnosis not present

## 2019-01-18 DIAGNOSIS — Z202 Contact with and (suspected) exposure to infections with a predominantly sexual mode of transmission: Secondary | ICD-10-CM

## 2019-01-18 DIAGNOSIS — B9689 Other specified bacterial agents as the cause of diseases classified elsewhere: Secondary | ICD-10-CM | POA: Diagnosis not present

## 2019-01-18 LAB — URINALYSIS, ROUTINE W REFLEX MICROSCOPIC
Bacteria, UA: NONE SEEN
Bilirubin Urine: NEGATIVE
Glucose, UA: NEGATIVE mg/dL
Hgb urine dipstick: NEGATIVE
Ketones, ur: NEGATIVE mg/dL
Nitrite: NEGATIVE
Protein, ur: NEGATIVE mg/dL
Specific Gravity, Urine: 1.024 (ref 1.005–1.030)
pH: 6 (ref 5.0–8.0)

## 2019-01-18 LAB — POC URINE PREG, ED: Preg Test, Ur: NEGATIVE

## 2019-01-18 NOTE — ED Triage Notes (Signed)
Pt reports vaginal irritation, burning when she urinates for the past week, pt would like to be checked for STDs.

## 2019-01-19 LAB — WET PREP, GENITAL
Sperm: NONE SEEN
Trich, Wet Prep: NONE SEEN

## 2019-01-19 LAB — GC/CHLAMYDIA PROBE AMP (~~LOC~~) NOT AT ARMC
Chlamydia: NEGATIVE
Neisseria Gonorrhea: NEGATIVE

## 2019-01-19 MED ORDER — METRONIDAZOLE 0.75 % EX GEL: 1.0000 "application " | Freq: Two times a day (BID) | CUTANEOUS | 0 refills | Status: AC

## 2019-01-19 MED ORDER — AZITHROMYCIN 1 G PO PACK
1.0000 g | PACK | Freq: Once | ORAL | Status: AC
  Administered 2019-01-19: 1 g via ORAL
  Filled 2019-01-19: qty 1

## 2019-01-19 MED ORDER — CEFTRIAXONE SODIUM 250 MG IJ SOLR
250.0000 mg | Freq: Once | INTRAMUSCULAR | Status: AC
  Administered 2019-01-19: 01:00:00 250 mg via INTRAMUSCULAR
  Filled 2019-01-19: qty 250

## 2019-01-19 MED ORDER — FLUCONAZOLE 150 MG PO TABS
150.0000 mg | ORAL_TABLET | Freq: Once | ORAL | Status: AC
  Administered 2019-01-19: 150 mg via ORAL
  Filled 2019-01-19: qty 1

## 2019-01-19 NOTE — Discharge Instructions (Addendum)
Refrain from sexual intercourse for at least 1 week and ensure all symptoms resolved.  Please recommend to your partner to get tested as well. Return for fevers, new or worsening symptoms.  Follow-up with women's clinic/gynecology.

## 2019-01-19 NOTE — ED Provider Notes (Signed)
[facility] EMERGENCY DEPARTMENT Provider Note   CSN: [id number] Arrival date & time: 01/18/19  2047    History   Chief Complaint Chief Complaint  Patient presents with  . Vaginal Itching  . Exposure to STD    HPI Catherine Morales is a 28 y.o. female.     Patient with history of bacterial vaginosis, chlamydia, HPV presents with vaginal irritation along with mild dysuria for the past week.  Patient concerned for possible STDs as female partner she feels has other partners.  Patient denies fevers or significant abdominal pain.  Patient concerned as she has had BV and Candida with antibiotics in the past.  Patient denies diabetes history.     Past Medical History:  Diagnosis Date  . BV (bacterial vaginosis)   . Chlamydia   . Chronic kidney disease   . HPV in female   . No pertinent past medical history   . Scoliosis     Patient Active Problem List   Diagnosis Date Noted  . Abnormal uterine bleeding 01/29/2018  . 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 Bacterial vaginitis 05/12/2017  . Chronic low back pain 04/21/2015  . Preventative health care 04/05/2015  . Pyelonephritis 04/02/2015  . 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 UNSPECIFIED VAGINITIS AND VULVOVAGINITIS 08/08/2010    Past Surgical History:  Procedure Laterality Date  . WISDOM TOOTH EXTRACTION       OB History    Gravida  1   Para  1   Term  1   Preterm      AB      Living  1     SAB      TAB      Ectopic      Multiple      Live Births           Obstetric Comments  Age 28 with first baby         Home Medications    Prior to Admission medications   Medication Sig Start Date End Date Taking? Authorizing Provider  acetaminophen (TYLENOL) 325 MG tablet Take 2 tablets (650 mg total) by mouth every 6 (six) hours as needed. 08/11/17   Derwood KaplanNanavati, Ankit, MD  fluconazole (DIFLUCAN) 150 MG tablet Take 1 tablet (150 mg total) by mouth daily. If you develop a  yeast infection (vaginal itching and white cottage cheese like discharge) 02/02/18   Ali LoweVogel, Marie S, MD  ibuprofen (ADVIL,MOTRIN) 600 MG tablet Take 1 tablet (600 mg total) by mouth every 6 (six) hours as needed for mild pain or moderate pain. 11/03/17   Antony MaduraHumes, Kelly, PA-C  metroNIDAZOLE (FLAGYL) 500 MG tablet Take 1 tablet (500 mg total) by mouth 2 (two) times daily. 04/13/18   Rasch, Victorino DikeJennifer I, NP  metroNIDAZOLE (METROGEL) 0.75 % gel Apply 1 application topically 2 (two) times daily for 5 days. 01/19/19 01/24/19  Blane OharaZavitz, Adrie Picking, MD    Family History Family History  Problem Relation Age of Onset  . Cancer Maternal Grandmother   . Cancer Paternal Grandmother     Social History Social History   Tobacco Use  . Smoking status: Current Every Day Smoker    Packs/day: 0.50    Types: Cigarettes  . Smokeless tobacco: Never Used  . Tobacco comment: 1 CIGARETTES A DAY  Substance Use Topics  . Alcohol use: Yes    Alcohol/week: 0.0 standard drinks    Comment: socially   . Drug use: No     Allergies  Patient has no known allergies.   Review of Systems Review of Systems  Constitutional: Negative for chills and fever.  Respiratory: Negative for shortness of breath.   Cardiovascular: Negative for chest pain.  Gastrointestinal: Negative for abdominal pain and vomiting.  Genitourinary: Positive for dysuria and vaginal pain. Negative for flank pain.  Musculoskeletal: Negative for back pain, neck pain and neck stiffness.  Skin: Negative for rash.  Neurological: Negative for headaches.     Physical Exam Updated Vital Signs BP 122/73 (BP Location: Right Arm)   Pulse 83   Temp 98.2 F (36.8 C)   Resp 14   Ht 5\' 6"  (1.676 m)   Wt 97.5 kg   SpO2 99%   BMI 34.70 kg/m   Physical Exam Vitals signs and nursing note reviewed.  Constitutional:      Appearance: She is well-developed.  HENT:     Head: Normocephalic.  Eyes:     General:        Right eye: No discharge.        Left eye:  No discharge.  Neck:     Trachea: No tracheal deviation.  Cardiovascular:     Rate and Rhythm: Normal rate.  Pulmonary:     Effort: Pulmonary effort is normal.  Abdominal:     Tenderness: There is no abdominal tenderness. There is no guarding.  Genitourinary:    Comments: Minimal discharge, no bleeding, mild discomfort cervix palpation. Skin:    General: Skin is warm.     Findings: No rash.  Neurological:     Mental Status: She is alert and oriented to person, place, and time.      ED Treatments / Results  Labs (all labs ordered are listed, but only abnormal results are displayed) Labs Reviewed  WET PREP, GENITAL - Abnormal; Notable for the following components:      Result Value   Yeast Wet Prep HPF POC PRESENT (*)    Clue Cells Wet Prep HPF POC PRESENT (*)    WBC, Wet Prep HPF POC MANY (*)    All other components within normal limits  URINALYSIS, ROUTINE W REFLEX MICROSCOPIC - Abnormal; Notable for the following components:   Leukocytes,Ua SMALL (*)    All other components within normal limits  POC URINE PREG, ED  GC/CHLAMYDIA PROBE AMP ([place]) NOT AT [facility]    EKG None  Radiology No results found.  Procedures Procedures (including critical care time)  Medications Ordered in ED Medications  fluconazole (DIFLUCAN) tablet 150 mg (has no administration in time range)  cefTRIAXone (ROCEPHIN) injection 250 mg (250 mg Intramuscular Given 01/19/19 0043)  azithromycin (ZITHROMAX) powder 1 g (1 g Oral Given 01/19/19 0043)     Initial Impression / Assessment and Plan / ED Course  I have reviewed the triage vital signs and the nursing notes.  Pertinent labs & imaging results that were available during my care of the patient were reviewed by me and considered in my medical decision making (see chart for details).        Patient presents with concern clinically for STDs from her female partner. Pelvic exam performed and STD test sent.  Patient is high risk with  history of STDs and plan for Rocephin/azithromycin and patient requesting fluconazole. Vaginitis screen came back positive for yeast and BV and with symptoms plan for treatments. Discussed outpatient follow-up. Urinalysis reviewed negative pregnancy, no sign of infection. Results and differential diagnosis were discussed with the patient/parent/guardian. Xrays were independently reviewed by myself.  Close follow up outpatient was discussed, comfortable with the plan.   Medications  fluconazole (DIFLUCAN) tablet 150 mg (has no administration in time range)  cefTRIAXone (ROCEPHIN) injection 250 mg (250 mg Intramuscular Given 01/19/19 0043)  azithromycin (ZITHROMAX) powder 1 g (1 g Oral Given 01/19/19 0043)    Vitals:   01/18/19 2107  BP: 122/73  Pulse: 83  Resp: 14  Temp: 98.2 F (36.8 C)  SpO2: 99%  Weight: 97.5 kg  Height: 5\' 6"  (1.676 m)    Final diagnoses:  Vaginal itching  Possible exposure to STD  BV (bacterial vaginosis)  Vagina, candidiasis    Final Clinical Impressions(s) / ED Diagnoses   Final diagnoses:  Vaginal itching  Possible exposure to STD  BV (bacterial vaginosis)  Vagina, candidiasis    ED Discharge Orders         Ordered    metroNIDAZOLE (METROGEL) 0.75 % gel  2 times daily     01/19/19 0054           Blane OharaZavitz, Dollene Mallery, MD 01/19/19 [phone]

## 2019-04-20 ENCOUNTER — Ambulatory Visit (HOSPITAL_COMMUNITY)
Admission: EM | Admit: 2019-04-20 | Discharge: 2019-04-20 | Disposition: A | Discharge status: Home / Self Care | Payer: Medicaid Other | Attending: Emergency Medicine | Admitting: Emergency Medicine

## 2019-04-20 ENCOUNTER — Encounter (HOSPITAL_COMMUNITY): Payer: Self-pay | Admitting: Emergency Medicine

## 2019-04-20 ENCOUNTER — Other Ambulatory Visit: Payer: Self-pay

## 2019-04-20 DIAGNOSIS — Z202 Contact with and (suspected) exposure to infections with a predominantly sexual mode of transmission: Secondary | ICD-10-CM | POA: Insufficient documentation

## 2019-04-20 DIAGNOSIS — N898 Other specified noninflammatory disorders of vagina: Secondary | ICD-10-CM | POA: Diagnosis not present

## 2019-04-20 DIAGNOSIS — Z3202 Encounter for pregnancy test, result negative: Secondary | ICD-10-CM

## 2019-04-20 DIAGNOSIS — Z113 Encounter for screening for infections with a predominantly sexual mode of transmission: Secondary | ICD-10-CM | POA: Diagnosis not present

## 2019-04-20 LAB — POCT URINALYSIS DIP (DEVICE)
Glucose, UA: NEGATIVE mg/dL
Hgb urine dipstick: NEGATIVE
Ketones, ur: NEGATIVE mg/dL
Leukocytes,Ua: NEGATIVE
Nitrite: NEGATIVE
Protein, ur: NEGATIVE mg/dL
Specific Gravity, Urine: >=1.03 (ref 1.005–1.030)
Urobilinogen, UA: 0.2 mg/dL (ref 0.0–1.0)
pH: 6 (ref 5.0–8.0)

## 2019-04-20 LAB — POCT PREGNANCY, URINE: Preg Test, Ur: NEGATIVE

## 2019-04-20 LAB — HIV ANTIBODY (ROUTINE TESTING W REFLEX): HIV Screen 4th Generation wRfx: NONREACTIVE

## 2019-04-20 MED ORDER — METRONIDAZOLE 500 MG PO TABS: 500.0000 mg | ORAL_TABLET | Freq: Two times a day (BID) | ORAL | 0 refills | Status: DC

## 2019-04-20 MED ORDER — FLUCONAZOLE 150 MG PO TABS: 150.0000 mg | ORAL_TABLET | Freq: Every day | ORAL | 0 refills | Status: DC

## 2019-04-20 MED ORDER — AZITHROMYCIN 250 MG PO TABS
1000.0000 mg | ORAL_TABLET | Freq: Once | ORAL | Status: AC
  Administered 2019-04-20: 1000 mg via ORAL

## 2019-04-20 MED ORDER — AZITHROMYCIN 250 MG PO TABS
ORAL_TABLET | ORAL | Status: AC
  Filled 2019-04-20: qty 4

## 2019-04-20 MED ORDER — CEFTRIAXONE SODIUM 250 MG IJ SOLR
INTRAMUSCULAR | Status: AC
  Filled 2019-04-20: qty 250

## 2019-04-20 MED ORDER — CEFTRIAXONE SODIUM 250 MG IJ SOLR
250.0000 mg | Freq: Once | INTRAMUSCULAR | Status: AC
  Administered 2019-04-20: 250 mg via INTRAMUSCULAR

## 2019-04-20 NOTE — ED Notes (Signed)
Urinalysis results in Epic. Provider didn't order this. Pt should not be charged for this test.

## 2019-04-20 NOTE — ED Provider Notes (Signed)
[facility]    CSN: [id number] Arrival date & time: 04/20/19  1937      History   Chief Complaint Chief Complaint  Patient presents with  . Vaginal Discharge    HPI Catherine Morales is a 28 y.o. female.   Patient presents with thick white vaginal discharge x1 week.  She requests STD testing and treatment.  She is sexually active and does not use condoms or OCP.  She denies fever, chills, abdominal pain, dysuria, back pain, pelvic pain, or other symptoms.  LMP: 4 weeks possibly but she is unsure.  Patient states she has a history of BV and candida.  The history is provided by the patient.    Past Medical History:  Diagnosis Date  . BV (bacterial vaginosis)   . Chlamydia   . Chronic kidney disease   . HPV in female   . No pertinent past medical history   . Scoliosis     Patient Active Problem List   Diagnosis Date Noted  . Abnormal uterine bleeding 01/29/2018  . 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 Bacterial vaginitis 05/12/2017  . Chronic low back pain 04/21/2015  . Preventative health care 04/05/2015  . Pyelonephritis 04/02/2015  . 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 UNSPECIFIED VAGINITIS AND VULVOVAGINITIS 08/08/2010    Past Surgical History:  Procedure Laterality Date  . WISDOM TOOTH EXTRACTION      OB History    Gravida  1   Para  1   Term  1   Preterm      AB      Living  1     SAB      TAB      Ectopic      Multiple      Live Births           Obstetric Comments  Age 24 with first baby         Home Medications    Prior to Admission medications   Medication Sig Start Date End Date Taking? Authorizing Provider  acetaminophen (TYLENOL) 325 MG tablet Take 2 tablets (650 mg total) by mouth every 6 (six) hours as needed. 08/11/17   Derwood Kaplan, MD  fluconazole (DIFLUCAN) 150 MG tablet Take 1 tablet (150 mg total) by mouth daily. Take one tablet today.  May repeat in 3 days. 04/20/19   Mickie Bail, NP   ibuprofen (ADVIL,MOTRIN) 600 MG tablet Take 1 tablet (600 mg total) by mouth every 6 (six) hours as needed for mild pain or moderate pain. 11/03/17   Antony Madura, PA-C  metroNIDAZOLE (FLAGYL) 500 MG tablet Take 1 tablet (500 mg total) by mouth 2 (two) times daily. 04/20/19   Mickie Bail, NP    Family History Family History  Problem Relation Age of Onset  . Cancer Maternal Grandmother   . Cancer Paternal Grandmother     Social History Social History   Tobacco Use  . Smoking status: Current Every Day Smoker    Packs/day: 0.50    Types: Cigarettes  . Smokeless tobacco: Never Used  . Tobacco comment: 1 CIGARETTES A DAY  Substance Use Topics  . Alcohol use: Yes    Alcohol/week: 0.0 standard drinks    Comment: socially   . Drug use: No     Allergies   Patient has no known allergies.   Review of Systems Review of Systems  Constitutional: Negative for chills and fever.  HENT: Negative for ear pain and sore throat.  Eyes: Negative for pain and visual disturbance.  Respiratory: Negative for cough and shortness of breath.   Cardiovascular: Negative for chest pain and palpitations.  Gastrointestinal: Negative for abdominal pain and vomiting.  Genitourinary: Positive for vaginal discharge. Negative for dysuria, flank pain, hematuria and pelvic pain.  Musculoskeletal: Negative for arthralgias and back pain.  Skin: Negative for color change and rash.  Neurological: Negative for seizures and syncope.  All other systems reviewed and are negative.    Physical Exam Triage Vital Signs ED Triage Vitals  Enc Vitals Group     BP 04/20/19 1953 128/74     Pulse Rate 04/20/19 1953 98     Resp 04/20/19 1953 18     Temp 04/20/19 1953 98.1 F (36.7 C)     Temp Source 04/20/19 1953 Oral     SpO2 04/20/19 1953 100 %     Weight --      Height --      Head Circumference --      Peak Flow --      Pain Score 04/20/19 1954 0     Pain Loc --      Pain Edu? --      Excl. in [place]? --     No data found.  Updated Vital Signs BP 128/74 (BP Location: Left Arm)   Pulse 98   Temp 98.1 F (36.7 C) (Oral)   Resp 18   SpO2 100%   Visual Acuity Right Eye Distance:   Left Eye Distance:   Bilateral Distance:    Right Eye Near:   Left Eye Near:    Bilateral Near:     Physical Exam Vitals signs and nursing note reviewed.  Constitutional:      General: She is not in acute distress.    Appearance: She is well-developed.  HENT:     Head: Normocephalic and atraumatic.     Mouth/Throat:     Mouth: Mucous membranes are moist.     Pharynx: Oropharynx is clear.  Eyes:     Conjunctiva/sclera: Conjunctivae normal.  Neck:     Musculoskeletal: Neck supple.  Cardiovascular:     Rate and Rhythm: Normal rate and regular rhythm.     Heart sounds: No murmur.  Pulmonary:     Effort: Pulmonary effort is normal. No respiratory distress.     Breath sounds: Normal breath sounds.  Abdominal:     General: Bowel sounds are normal.     Palpations: Abdomen is soft.     Tenderness: There is no abdominal tenderness. There is no right CVA tenderness, left CVA tenderness, guarding or rebound.  Skin:    General: Skin is warm and dry.     Findings: No rash.  Neurological:     Mental Status: She is alert.      UC Treatments / Results  Labs (all labs ordered are listed, but only abnormal results are displayed) Labs Reviewed  POCT URINALYSIS DIP (DEVICE) - Abnormal; Notable for the following components:      Result Value   Bilirubin Urine SMALL (*)    All other components within normal limits  RPR  HIV ANTIBODY (ROUTINE TESTING W REFLEX)  POC URINE PREG, ED  CERVICOVAGINAL ANCILLARY ONLY    EKG   Radiology No results found.  Procedures Procedures (including critical care time)  Medications Ordered in UC Medications  cefTRIAXone (ROCEPHIN) injection 250 mg (has no administration in time range)  azithromycin (ZITHROMAX) tablet 1,000 mg (has no administration in time  range)     Initial Impression / Assessment and Plan / UC Course  I have reviewed the triage vital signs and the nursing notes.  Pertinent labs & imaging results that were available during my care of the patient were reviewed by me and considered in my medical decision making (see chart for details).   Vaginal discharge, possible exposure to STD.  Urine pregnancy negative.  Vaginal swab for STDs obtained.  HIV and RPR pending.  Treating with Rocephin, Zithromax, metronidazole, Diflucan.  Instructed patient to refrain from sex for 7 days.  Discussed that we will call her if her STD tests are positive and that she and her partner may need additional treatment at that time.  Patient agrees to plan of care.   Final Clinical Impressions(s) / UC Diagnoses   Final diagnoses:  Vaginal discharge  Possible exposure to STD     Discharge Instructions     You were treated with two antibiotics today, Rocephin and Zithromax.  Additionally, you are prescribed metronidazole and Diflucan for your symptoms.    Do not have sex for 7 days.  Your STD tests are pending.  If your test results are positive, we will call you.  You may need additional treatment and your partner may also need treatment.           ED Prescriptions    Medication Sig Dispense Auth. Provider   fluconazole (DIFLUCAN) 150 MG tablet Take 1 tablet (150 mg total) by mouth daily. Take one tablet today.  May repeat in 3 days. 2 tablet Mickie Bailate, Kyana Aicher H, NP   metroNIDAZOLE (FLAGYL) 500 MG tablet Take 1 tablet (500 mg total) by mouth 2 (two) times daily. 14 tablet Mickie Bailate, Olean Sangster H, NP     PDMP not reviewed this encounter.   Mickie Bailate, Dyshaun Bonzo H, NP 04/20/19 2013

## 2019-04-20 NOTE — Discharge Instructions (Addendum)
You were treated with two antibiotics today, Rocephin and Zithromax.  Additionally, you are prescribed metronidazole and Diflucan for your symptoms.    Do not have sex for 7 days.  Your STD tests are pending.  If your test results are positive, we will call you.  You may need additional treatment and your partner may also need treatment.

## 2019-04-20 NOTE — ED Triage Notes (Signed)
Pt here for vaginal discharge  

## 2019-04-21 LAB — CERVICOVAGINAL ANCILLARY ONLY
Bacterial Vaginitis (gardnerella): NEGATIVE
Candida Glabrata: NEGATIVE
Candida Vaginitis: POSITIVE — AB
Chlamydia: NEGATIVE
Molecular Disclaimer: NEGATIVE
Molecular Disclaimer: NEGATIVE
Molecular Disclaimer: NEGATIVE
Molecular Disclaimer: NEGATIVE
Molecular Disclaimer: NORMAL
Molecular Disclaimer: NORMAL
Neisseria Gonorrhea: NEGATIVE
Trichomonas: NEGATIVE

## 2019-04-22 LAB — RPR: RPR Ser Ql: NONREACTIVE

## 2019-07-01 ENCOUNTER — Encounter (HOSPITAL_COMMUNITY): Payer: Self-pay | Admitting: Emergency Medicine

## 2019-07-01 ENCOUNTER — Ambulatory Visit (HOSPITAL_COMMUNITY)
Admission: EM | Admit: 2019-07-01 | Discharge: 2019-07-01 | Disposition: A | Discharge status: Home / Self Care | Payer: Medicaid Other | Attending: Internal Medicine | Admitting: Internal Medicine

## 2019-07-01 DIAGNOSIS — M791 Myalgia, unspecified site: Secondary | ICD-10-CM

## 2019-07-01 DIAGNOSIS — N898 Other specified noninflammatory disorders of vagina: Secondary | ICD-10-CM | POA: Diagnosis not present

## 2019-07-01 MED ORDER — FLUCONAZOLE 150 MG PO TABS: 150.0000 mg | ORAL_TABLET | Freq: Every day | ORAL | 0 refills | Status: DC

## 2019-07-01 MED ORDER — METRONIDAZOLE 500 MG PO TABS: 500.0000 mg | ORAL_TABLET | Freq: Two times a day (BID) | ORAL | 0 refills | Status: DC

## 2019-07-01 MED ORDER — IBUPROFEN 600 MG PO TABS: 600.0000 mg | ORAL_TABLET | Freq: Four times a day (QID) | ORAL | 0 refills | Status: DC | PRN

## 2019-07-01 MED ORDER — CYCLOBENZAPRINE HCL 10 MG PO TABS: 10.0000 mg | ORAL_TABLET | Freq: Two times a day (BID) | ORAL | 0 refills | Status: DC | PRN
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
## 2019-07-05 DIAGNOSIS — Z20828 Contact with and (suspected) exposure to other viral communicable diseases: Secondary | ICD-10-CM | POA: Diagnosis not present

## 2019-07-08 ENCOUNTER — Encounter (HOSPITAL_COMMUNITY): Payer: Self-pay

## 2019-07-08 ENCOUNTER — Other Ambulatory Visit: Payer: Self-pay

## 2019-07-08 ENCOUNTER — Ambulatory Visit (HOSPITAL_COMMUNITY)
Admission: EM | Admit: 2019-07-08 | Discharge: 2019-07-08 | Disposition: A | Discharge status: Home / Self Care | Payer: Medicaid Other | Attending: Family Medicine | Admitting: Family Medicine

## 2019-07-08 DIAGNOSIS — L0291 Cutaneous abscess, unspecified: Secondary | ICD-10-CM | POA: Diagnosis not present

## 2019-07-08 MED ORDER — HYDROCODONE-ACETAMINOPHEN 7.5-325 MG PO TABS: 1.0000 | ORAL_TABLET | Freq: Four times a day (QID) | ORAL | 0 refills | Status: DC | PRN

## 2019-07-08 MED ORDER — SULFAMETHOXAZOLE-TRIMETHOPRIM 800-160 MG PO TABS: 1.0000 | ORAL_TABLET | Freq: Two times a day (BID) | ORAL | 0 refills | Status: AC

## 2019-07-08 NOTE — Discharge Instructions (Addendum)
Warm compresses Take the antibiotic 2 x a day Take the pain medicine as needed Do not drive on the pain medication Return as needed

## 2019-07-08 NOTE — ED Triage Notes (Signed)
Pt states she has boil in the back of her head in her hair line. Pt states its been there for a week or more. Pt states she has tried the squeeze it.

## 2019-07-08 NOTE — ED Provider Notes (Signed)
[place]    CSN: [id number] Arrival date & time: 07/08/19  1330      History   Chief Complaint Chief Complaint  Patient presents with  . Abscess    HPI Catherine Morales is a 28 y.o. female.   HPI  Patient has an abscess on the back of her head.  It is at the hairline on the right side.  She states that it is excruciatingly painful.  She has been trying to press on it but has been unable to express any of the purulence.  No fever or chills.  Past Medical History:  Diagnosis Date  . BV (bacterial vaginosis)   . Chlamydia   . Chronic kidney disease   . HPV in female   . No pertinent past medical history   . Scoliosis     Patient Active Problem List   Diagnosis Date Noted  . Abnormal uterine bleeding 01/29/2018  . 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 Bacterial vaginitis 05/12/2017  . Chronic low back pain 04/21/2015  . Preventative health care 04/05/2015  . Pyelonephritis 04/02/2015  . 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 UNSPECIFIED VAGINITIS AND VULVOVAGINITIS 08/08/2010    Past Surgical History:  Procedure Laterality Date  . WISDOM TOOTH EXTRACTION      OB History    Gravida  1   Para  1   Term  1   Preterm      AB      Living  1     SAB      TAB      Ectopic      Multiple      Live Births           Obstetric Comments  Age 48 with first baby         Home Medications    Prior to Admission medications   Medication Sig Start Date End Date Taking? Authorizing Provider  acetaminophen (TYLENOL) 325 MG tablet Take 2 tablets (650 mg total) by mouth every 6 (six) hours as needed. 08/11/17   Varney Biles, MD  HYDROcodone-acetaminophen (NORCO) 7.5-325 MG tablet Take 1 tablet by mouth every 6 (six) hours as needed for moderate pain. 07/08/19   Raylene Everts, MD  ibuprofen (ADVIL) 600 MG tablet Take 1 tablet (600 mg total) by mouth every 6 (six) hours as needed for mild pain or moderate pain. 07/01/19   LampteyMyrene Galas, MD  sulfamethoxazole-trimethoprim (BACTRIM DS) 800-160 MG tablet Take 1 tablet by mouth 2 (two) times daily for 7 days. 07/08/19 07/15/19  Raylene Everts, MD    Family History Family History  Problem Relation Age of Onset  . Cancer Maternal Grandmother   . Cancer Paternal Grandmother   . Healthy Mother   . Healthy Father     Social History Social History   Tobacco Use  . Smoking status: Current Every Day Smoker    Packs/day: 0.50    Types: Cigarettes  . Smokeless tobacco: Never Used  . Tobacco comment: 1 CIGARETTES A DAY  Substance Use Topics  . Alcohol use: Yes    Alcohol/week: 0.0 standard drinks    Comment: socially   . Drug use: No     Allergies   Patient has no known allergies.   Review of Systems Review of Systems  Constitutional: Negative for chills and fever.  HENT: Negative for congestion and hearing loss.   Eyes: Negative for pain.  Respiratory: Negative for cough and shortness of  breath.   Cardiovascular: Negative for chest pain and leg swelling.  Gastrointestinal: Negative for abdominal pain, constipation and diarrhea.  Genitourinary: Negative for dysuria and frequency.  Musculoskeletal: Negative for myalgias.  Skin: Positive for wound.  Neurological: Negative for dizziness, seizures and headaches.  Psychiatric/Behavioral: The patient is not nervous/anxious.      Physical Exam Triage Vital Signs ED Triage Vitals  Enc Vitals Group     BP 07/08/19 1403 124/78     Pulse Rate 07/08/19 1403 92     Resp 07/08/19 1403 18     Temp 07/08/19 1403 98.6 F (37 C)     Temp Source 07/08/19 1403 Oral     SpO2 07/08/19 1403 100 %     Weight 07/08/19 1400 226 lb (102.5 kg)     Height --      Head Circumference --      Peak Flow --      Pain Score 07/08/19 1400 10     Pain Loc --      Pain Edu? --      Excl. in [place]? --    No data found.  Updated Vital Signs BP 124/78 (BP Location: Right Arm)   Pulse 92   Temp 98.6 F (37 C) (Oral)    Resp 18   Wt 102.5 kg   LMP 06/24/2019   SpO2 100%   BMI 36.48 kg/m   Visual Acuity Right Eye Distance:   Left Eye Distance:   Bilateral Distance:    Right Eye Near:   Left Eye Near:    Bilateral Near:     Physical Exam Constitutional:      General: She is not in acute distress.    Appearance: She is well-developed. She is obese.  HENT:     Head: Normocephalic and atraumatic.     Mouth/Throat:     Comments: Mask in place Eyes:     Conjunctiva/sclera: Conjunctivae normal.     Pupils: Pupils are equal, round, and reactive to light.  Neck:   Cardiovascular:     Rate and Rhythm: Normal rate.  Pulmonary:     Effort: Pulmonary effort is normal. No respiratory distress.  Abdominal:     General: There is no distension.     Palpations: Abdomen is soft.  Musculoskeletal:        General: Normal range of motion.     Cervical back: Normal range of motion.  Skin:    General: Skin is warm and dry.  Neurological:     Mental Status: She is alert.      UC Treatments / Results  Labs (all labs ordered are listed, but only abnormal results are displayed) Labs Reviewed - No data to display  EKG   Radiology No results found.  Procedures Procedures (including critical care time)  Medications Ordered in UC Medications - No data to display  Initial Impression / Assessment and Plan / UC Course  I have reviewed the triage vital signs and the nursing notes.  Pertinent labs & imaging results that were available during my care of the patient were reviewed by me and considered in my medical decision making (see chart for details).    *Patient requests that this be I&D.  Was able to express most of the purulence without incision.  She feels like more needs to be drained.  I told her that the induration was caused by infection in the skin and would go down over time. Final Clinical Impressions(s) / UC  Diagnoses   Final diagnoses:  Abscess     Discharge Instructions       Warm compresses Take the antibiotic 2 x a day Take the pain medicine as needed Do not drive on the pain medication Return as needed   ED Prescriptions    Medication Sig Dispense Auth. Provider   sulfamethoxazole-trimethoprim (BACTRIM DS) 800-160 MG tablet Take 1 tablet by mouth 2 (two) times daily for 7 days. 14 tablet Eustace Moore, MD   HYDROcodone-acetaminophen [facility]) 7.5-325 MG tablet Take 1 tablet by mouth every 6 (six) hours as needed for moderate pain. 12 tablet Eustace Moore, MD     I have reviewed the PDMP during this encounter.   Eustace Moore, MD 07/08/19 [phone]

## 2019-07-26 ENCOUNTER — Inpatient Hospital Stay (HOSPITAL_COMMUNITY): Payer: Medicaid Other

## 2019-07-26 ENCOUNTER — Inpatient Hospital Stay (HOSPITAL_COMMUNITY)
Admission: AD | Admit: 2019-07-26 | Discharge: 2019-07-26 | Disposition: A | Discharge status: Home / Self Care | Payer: Medicaid Other | Attending: Family Medicine | Admitting: Family Medicine

## 2019-07-26 ENCOUNTER — Encounter (HOSPITAL_COMMUNITY): Payer: Self-pay | Admitting: Family Medicine

## 2019-07-26 ENCOUNTER — Other Ambulatory Visit: Payer: Self-pay

## 2019-07-26 DIAGNOSIS — M419 Scoliosis, unspecified: Secondary | ICD-10-CM | POA: Diagnosis not present

## 2019-07-26 DIAGNOSIS — F17210 Nicotine dependence, cigarettes, uncomplicated: Secondary | ICD-10-CM | POA: Insufficient documentation

## 2019-07-26 DIAGNOSIS — R109 Unspecified abdominal pain: Secondary | ICD-10-CM | POA: Diagnosis present

## 2019-07-26 DIAGNOSIS — O209 Hemorrhage in early pregnancy, unspecified: Secondary | ICD-10-CM | POA: Diagnosis not present

## 2019-07-26 DIAGNOSIS — Z3A01 Less than 8 weeks gestation of pregnancy: Secondary | ICD-10-CM

## 2019-07-26 DIAGNOSIS — O26899 Other specified pregnancy related conditions, unspecified trimester: Secondary | ICD-10-CM

## 2019-07-26 DIAGNOSIS — O3680X0 Pregnancy with inconclusive fetal viability, not applicable or unspecified: Secondary | ICD-10-CM

## 2019-07-26 DIAGNOSIS — O99330 Smoking (tobacco) complicating pregnancy, unspecified trimester: Secondary | ICD-10-CM | POA: Diagnosis not present

## 2019-07-26 DIAGNOSIS — Z3A00 Weeks of gestation of pregnancy not specified: Secondary | ICD-10-CM | POA: Diagnosis not present

## 2019-07-26 DIAGNOSIS — Z6790 Unspecified blood type, Rh positive: Secondary | ICD-10-CM

## 2019-07-26 LAB — URINALYSIS, ROUTINE W REFLEX MICROSCOPIC
Bacteria, UA: NONE SEEN
Bilirubin Urine: NEGATIVE
Glucose, UA: NEGATIVE mg/dL
Hgb urine dipstick: NEGATIVE
Ketones, ur: NEGATIVE mg/dL
Leukocytes,Ua: NEGATIVE
Nitrite: NEGATIVE
Protein, ur: 30 mg/dL — AB
Specific Gravity, Urine: 1.027 (ref 1.005–1.030)
pH: 6 (ref 5.0–8.0)

## 2019-07-26 LAB — HCG, QUANTITATIVE, PREGNANCY: hCG, Beta Chain, Quant, S: 271 m[IU]/mL — ABNORMAL HIGH (ref <5)

## 2019-07-26 LAB — COMPREHENSIVE METABOLIC PANEL
ALT: 19 U/L (ref 0–44)
AST: 18 U/L (ref 15–41)
Albumin: 3.6 g/dL (ref 3.5–5.0)
Alkaline Phosphatase: 48 U/L (ref 38–126)
Anion gap: 8 (ref 5–15)
BUN: 5 mg/dL — ABNORMAL LOW (ref 6–20)
CO2: 24 mmol/L (ref 22–32)
Calcium: 9.2 mg/dL (ref 8.9–10.3)
Chloride: 106 mmol/L (ref 98–111)
Creatinine, Ser: 0.55 mg/dL (ref 0.44–1.00)
GFR calc Af Amer: >60 mL/min (ref >60)
GFR calc non Af Amer: >60 mL/min (ref >60)
Glucose, Bld: 105 mg/dL — ABNORMAL HIGH (ref 70–99)
Potassium: 3.8 mmol/L (ref 3.5–5.1)
Sodium: 138 mmol/L (ref 135–145)
Total Bilirubin: 1 mg/dL (ref 0.3–1.2)
Total Protein: 6.6 g/dL (ref 6.5–8.1)

## 2019-07-26 LAB — CBC
HCT: 36.5 % (ref 36.0–46.0)
Hemoglobin: 12 g/dL (ref 12.0–15.0)
MCH: 31.3 pg (ref 26.0–34.0)
MCHC: 32.9 g/dL (ref 30.0–36.0)
MCV: 95.3 fL (ref 80.0–100.0)
Platelets: 326 10*3/uL (ref 150–400)
RBC: 3.83 MIL/uL — ABNORMAL LOW (ref 3.87–5.11)
RDW: 12.7 % (ref 11.5–15.5)
WBC: 5.9 10*3/uL (ref 4.0–10.5)
nRBC: 0 % (ref 0.0–0.2)

## 2019-07-26 LAB — WET PREP, GENITAL
Clue Cells Wet Prep HPF POC: NONE SEEN
Sperm: NONE SEEN
Trich, Wet Prep: NONE SEEN
Yeast Wet Prep HPF POC: NONE SEEN

## 2019-07-26 LAB — POCT PREGNANCY, URINE: Preg Test, Ur: POSITIVE — AB

## 2019-07-26 NOTE — MAU Provider Note (Signed)
History     CSN: [id number]  Arrival date and time: 07/26/19 1607   First Provider Initiated Contact with Patient 07/26/19 [phone]      Chief Complaint  Patient presents with  . Possible Pregnancy  . Vaginal Bleeding   Catherine Morales is a 29 y.o. G2P1001 at Unknown who presents to MAU for abdominal pain.  Onset: "a couple of days" ago Location: lower abdomen, central Duration: a couple days Character: pressure, more present at night, intermittent Aggravating/Associated: none/none Relieving: none Treatment: none Severity: 0/10  Pt denies VB, vaginal discharge/odor/itching. Pt denies N/V, constipation, diarrhea, or urinary problems. Pt denies fever, chills, fatigue, sweating or changes in appetite. Pt denies SOB or chest pain. Pt denies dizziness, HA, light-headedness, weakness.  Problems this pregnancy include: pt has not yet been seen this pregnancy. Allergies? NKDA Current medications/supplements? none Prenatal care provider? pt requests list of OB providers   OB History    Gravida  2   Para  1   Term  1   Preterm      AB      Living  1     SAB      TAB      Ectopic      Multiple      Live Births           Obstetric Comments  Age 8 with first baby        Past Medical History:  Diagnosis Date  . BV (bacterial vaginosis)   . Chlamydia   . HPV in female   . No pertinent past medical history   . Scoliosis     Past Surgical History:  Procedure Laterality Date  . WISDOM TOOTH EXTRACTION      Family History  Problem Relation Age of Onset  . Cancer Maternal Grandmother   . Cancer Paternal Grandmother   . Healthy Mother   . Healthy Father     Social History   Tobacco Use  . Smoking status: Current Every Day Smoker    Packs/day: 0.25    Types: Cigarettes  . Smokeless tobacco: Never Used  Substance Use Topics  . Alcohol use: Not Currently    Alcohol/week: 0.0 standard drinks    Comment: socially   . Drug use: No     Allergies: No Known Allergies  Medications Prior to Admission  Medication Sig Dispense Refill Last Dose  . acetaminophen (TYLENOL) 325 MG tablet Take 2 tablets (650 mg total) by mouth every 6 (six) hours as needed. 30 tablet 0 Past Month at Unknown time  . HYDROcodone-acetaminophen (NORCO) 7.5-325 MG tablet Take 1 tablet by mouth every 6 (six) hours as needed for moderate pain. 12 tablet 0 Past Month at Unknown time  . ibuprofen (ADVIL) 600 MG tablet Take 1 tablet (600 mg total) by mouth every 6 (six) hours as needed for mild pain or moderate pain. 30 tablet 0 Past Month at Unknown time    Review of Systems  Constitutional: Negative for chills, diaphoresis, fatigue and fever.  Eyes: Negative for visual disturbance.  Respiratory: Negative for shortness of breath.   Cardiovascular: Negative for chest pain.  Gastrointestinal: Positive for abdominal pain. Negative for constipation, diarrhea, nausea and vomiting.  Genitourinary: Negative for dysuria, flank pain, frequency, pelvic pain, urgency, vaginal bleeding and vaginal discharge.  Neurological: Negative for dizziness, weakness, light-headedness and headaches.   Physical Exam   Blood pressure 121/72, pulse 73, temperature 98.4 F (36.9 C), temperature source Oral, resp. rate  18, height 5\' 5"  (1.651 m), weight 100.2 kg, last menstrual period 06/16/2019, SpO2 99 %.  Patient Vitals for the past 24 hrs:  BP Temp Temp src Pulse Resp SpO2 Height Weight  07/26/19 0906 121/72 98.4 F (36.9 C) Oral 73 18 - 5\' 5"  (1.651 m) 100.2 kg  07/26/19 0905 - - - - - 99 % - -   Physical Exam  Constitutional: She is oriented to person, place, and time. She appears well-developed and well-nourished. No distress.  HENT:  Head: Normocephalic and atraumatic.  Respiratory: Effort normal.  GI: Soft. She exhibits no distension and no mass. There is no abdominal tenderness. There is no rebound and no guarding.  Neurological: She is alert and oriented to  person, place, and time.  Skin: Skin is warm and dry. She is not diaphoretic.  Psychiatric: She has a normal mood and affect. Her behavior is normal. Judgment and thought content normal.   Results for orders placed or performed during the hospital encounter of 07/26/19 (from the past 24 hour(s))  Pregnancy, urine POC     Status: Abnormal   Collection Time: 07/26/19  9:20 AM  Result Value Ref Range   Preg Test, Ur POSITIVE (A) NEGATIVE  Urinalysis, Routine w reflex microscopic     Status: Abnormal   Collection Time: 07/26/19  9:26 AM  Result Value Ref Range   Color, Urine YELLOW YELLOW   APPearance HAZY (A) CLEAR   Specific Gravity, Urine 1.027 1.005 - 1.030   pH 6.0 5.0 - 8.0   Glucose, UA NEGATIVE NEGATIVE mg/dL   Hgb urine dipstick NEGATIVE NEGATIVE   Bilirubin Urine NEGATIVE NEGATIVE   Ketones, ur NEGATIVE NEGATIVE mg/dL   Protein, ur 30 (A) NEGATIVE mg/dL   Nitrite NEGATIVE NEGATIVE   Leukocytes,Ua NEGATIVE NEGATIVE   RBC / HPF 0-5 0 - 5 RBC/hpf   WBC, UA 0-5 0 - 5 WBC/hpf   Bacteria, UA NONE SEEN NONE SEEN   Squamous Epithelial / LPF 6-10 0 - 5   Mucus PRESENT   CBC     Status: Abnormal   Collection Time: 07/26/19  9:57 AM  Result Value Ref Range   WBC 5.9 4.0 - 10.5 K/uL   RBC 3.83 (L) 3.87 - 5.11 MIL/uL   Hemoglobin 12.0 12.0 - 15.0 g/dL   HCT 09/23/19 09/23/19 - 14.4 %   MCV 95.3 80.0 - 100.0 fL   MCH 31.3 26.0 - 34.0 pg   MCHC 32.9 30.0 - 36.0 g/dL   RDW 31.5 40.0 - 86.7 %   Platelets 326 150 - 400 K/uL   nRBC 0.0 0.0 - 0.2 %  Comprehensive metabolic panel     Status: Abnormal   Collection Time: 07/26/19  9:57 AM  Result Value Ref Range   Sodium 138 135 - 145 mmol/L   Potassium 3.8 3.5 - 5.1 mmol/L   Chloride 106 98 - 111 mmol/L   CO2 24 22 - 32 mmol/L   Glucose, Bld 105 (H) 70 - 99 mg/dL   BUN 5 (L) 6 - 20 mg/dL   Creatinine, Ser 50.9 0.44 - 1.00 mg/dL   Calcium 9.2 8.9 - 09/23/19 mg/dL   Total Protein 6.6 6.5 - 8.1 g/dL   Albumin 3.6 3.5 - 5.0 g/dL   AST 18 15 -  41 U/L   ALT 19 0 - 44 U/L   Alkaline Phosphatase 48 38 - 126 U/L   Total Bilirubin 1.0 0.3 - 1.2 mg/dL   GFR calc non  Af Amer >60 >60 mL/min   GFR calc Af Amer >60 >60 mL/min   Anion gap 8 5 - 15  hCG, quantitative, pregnancy     Status: Abnormal   Collection Time: 07/26/19  9:57 AM  Result Value Ref Range   hCG, Beta Chain, Quant, S 271 (H) <5 mIU/mL  Wet prep, genital     Status: Abnormal   Collection Time: 07/26/19 10:14 AM  Result Value Ref Range   Yeast Wet Prep HPF POC NONE SEEN NONE SEEN   Trich, Wet Prep NONE SEEN NONE SEEN   Clue Cells Wet Prep HPF POC NONE SEEN NONE SEEN   WBC, Wet Prep HPF POC MANY (A) NONE SEEN   Sperm NONE SEEN    US OB LESS THAN 14 WEEKS WITH OB TRANSVAGINAL  Result Date: 07/26/2019 CLINICAL DATA:  Vaginal bleeding EXAM: OBSTETRIC <14 WK [place] AND TRANSVAGINAL OB US TECHNIQUE: Both transabdominal and transvaginal ultrasound examinations were performed for complete evaluation of the gestation as well as the maternal uterus, adnexal regions, and pelvic cul-de-sac. Transvaginal technique was performed to assess early pregnancy. COMPARISON:  None. FINDINGS: Intrauterine gestational sac: Not visualized Yolk sac:  Not visualized Embryo:  Not visualized Cardiac Activity: Not visualized Subchorionic hemorrhage:  None visualized. Maternal uterus/adnexae: Endometrium appears thickened with several small cystic areas within the endometrium which do not appear to represent a convincing gestational sac. Right ovary measures 2.2 x 1.7 x 1.6 cm. Left ovary measures 1.6 x 2.7 x 1.9 cm. No extrauterine pelvic mass. No free pelvic fluid. IMPRESSION: No intrauterine gestation seen on current examination. Endometrium is somewhat thickened with several small cystic areas which do not represent convincing gestational sacs. Assuming positive beta HCG assessment, differential considerations in this circumstance must include intrauterine gestation too early to be seen by either  transabdominal or transvaginal technique; recent spontaneous abortion; ectopic gestation. Close clinical and laboratory surveillance in this regard advised. Timing of repeat ultrasound will in large part depend on beta HCG values going forward. Study otherwise unremarkable. Electronically Signed   By: Lowella Grip III M.D.   On: 07/26/2019 10:51    MAU Course  Procedures  MDM -r/o ectopic -UA: hazy/30PRO,sending urine for culture based on symptoms -CBC: WNL -CMP: WNL -[place]: pregnancy of unknown location, no extrauterine pelvic mass, no free pelvic fluid -hCG: 271 -ABO: B Positive (per scanned records 06/07/2011) -WetPrep: WNL (blind swab by RN) -GC/CT collected (blind swab by RN) -pt discharged to home in stable condition  Orders Placed This Encounter  Procedures  . Wet prep, genital    Standing Status:   Standing    Number of Occurrences:   1  . Culture, OB Urine    Standing Status:   Standing    Number of Occurrences:   1  . US OB LESS THAN 14 WEEKS WITH OB TRANSVAGINAL    Standing Status:   Standing    Number of Occurrences:   1    Order Specific Question:   Symptom/Reason for Exam    Answer:   Abdominal pain in pregnancy [[id number]]  . Urinalysis, Routine w reflex microscopic    Standing Status:   Standing    Number of Occurrences:   1  . CBC    Standing Status:   Standing    Number of Occurrences:   1  . Comprehensive metabolic panel    Standing Status:   Standing    Number of Occurrences:   1  . hCG, quantitative, pregnancy  Standing Status:   Standing    Number of Occurrences:   1  . Pregnancy, urine POC    Standing Status:   Standing    Number of Occurrences:   1  . Discharge patient    Order Specific Question:   Discharge disposition    Answer:   01-Home or Self Care [1]    Order Specific Question:   Discharge patient date    Answer:   07/26/2019   Assessment and Plan   1. Pregnancy of unknown anatomic location   2. Abdominal pain in pregnancy   3.  Blood type, Rh positive    Allergies as of 07/26/2019   No Known Allergies     Medication List    STOP taking these medications   ibuprofen 600 MG tablet Commonly known as: ADVIL     TAKE these medications   acetaminophen 325 MG tablet Commonly known as: Tylenol Take 2 tablets (650 mg total) by mouth every 6 (six) hours as needed.   HYDROcodone-acetaminophen 7.5-325 MG tablet Commonly known as: Norco Take 1 tablet by mouth every 6 (six) hours as needed for moderate pain.      -will call with culture results, if positive -appt scheduled [facility] f/[facility] at [facility] 07/28/2019 @0830AM , discussed impt of follow-up for pregnancy of unknown location -strict ectopic/bleeding/pain/return MAU precautions given -pt discharged to home in stable condition   E Sergio Hobart 07/26/2019, 11:28 AM

## 2019-07-26 NOTE — Discharge Instructions (Signed)
Abdominal Pain During Pregnancy  Abdominal pain is common during pregnancy, and has many possible causes. Some causes are more serious than others, and sometimes the cause is not known. Abdominal pain can be a sign that labor is starting. It can also be caused by normal growth and stretching of muscles and ligaments during pregnancy. Always tell your health care provider if you have any abdominal pain. Follow these instructions at home:  Do not have sex or put anything in your vagina until your pain goes away completely.  Get plenty of rest until your pain improves.  Drink enough fluid to keep your urine pale yellow.  Take over-the-counter and prescription medicines only as told by your health care provider.  Keep all follow-up visits as told by your health care provider. This is important. Contact a health care provider if:  Your pain continues or gets worse after resting.  You have lower abdominal pain that: ? Comes and goes at regular intervals. ? Spreads to your back. ? Is similar to menstrual cramps.  You have pain or burning when you urinate. Get help right away if:  You have a fever or chills.  You have vaginal bleeding.  You are leaking fluid from your vagina.  You are passing tissue from your vagina.  You have vomiting or diarrhea that lasts for more than 24 hours.  Your baby is moving less than usual.  You feel very weak or faint.  You have shortness of breath.  You develop severe pain in your upper abdomen. Summary  Abdominal pain is common during pregnancy, and has many possible causes.  If you experience abdominal pain during pregnancy, tell your health care provider right away.  Follow your health care provider's home care instructions and keep all follow-up visits as directed. This information is not intended to replace advice given to you by your health care provider. Make sure you discuss any questions you have with your health care  provider. Document Revised: 10/26/2018 Document Reviewed: 10/10/2016 Elsevier Patient Education  2020 [employer]  Ectopic Pregnancy  An ectopic pregnancy is when the fertilized egg attaches (implants) outside the uterus. Most ectopic pregnancies occur in one of the tubes where eggs travel from the ovary to the uterus (fallopian tubes), but the implanting can occur in other locations. In rare cases, ectopic pregnancies occur on the ovary, intestine, pelvis, abdomen, or cervix. In an ectopic pregnancy, the fertilized egg does not have the ability to develop into a normal, healthy baby. A ruptured ectopic pregnancy is one in which tearing or bursting of a fallopian tube causes internal bleeding. Often, there is intense lower abdominal pain, and vaginal bleeding sometimes occurs. Having an ectopic pregnancy can be life-threatening. If this dangerous condition is not treated, it can lead to blood loss, shock, or even death. What are the causes? The most common cause of this condition is damage to one of the fallopian tubes. A fallopian tube may be narrowed or blocked, and that keeps the fertilized egg from reaching the uterus. What increases the risk? This condition is more likely to develop in women of childbearing age who have different levels of risk. The levels of risk can be divided into three categories. High risk  You have gone through infertility treatment.  You have had an ectopic pregnancy before.  You have had surgery on the fallopian tubes, or another surgical procedure, such as an abortion.  You have had surgery to have the fallopian tubes tied (tubal ligation).  You  have problems or diseases of the fallopian tubes.  You have been exposed to diethylstilbestrol (DES). This medicine was used until 1971, and it had effects on babies whose mothers took the medicine.  You become pregnant while using an IUD (intrauterine device) for birth control. Moderate risk  You have a history  of infertility.  You have had an STI (sexually transmitted infection).  You have a history of pelvic inflammatory disease (PID).  You have scarring from endometriosis.  You have multiple sexual partners.  You smoke. Low risk  You have had pelvic surgery.  You use vaginal douches.  You became sexually active before age 64. What are the signs or symptoms? Common symptoms of this condition include normal pregnancy symptoms, such as missing a period, nausea, tiredness, abdominal pain, breast tenderness, and bleeding. However, ectopic pregnancy will have additional symptoms, such as:  Pain with intercourse.  Irregular vaginal bleeding or spotting.  Cramping or pain on one side or in the lower abdomen.  Fast heartbeat, low blood pressure, and sweating.  Passing out while having a bowel movement. Symptoms of a ruptured ectopic pregnancy and internal bleeding may include:  Sudden, severe pain in the abdomen and pelvis.  Dizziness, weakness, light-headedness, or fainting.  Pain in the shoulder or neck area. How is this diagnosed? This condition is diagnosed by:  A pelvic exam to locate pain or a mass in the abdomen.  A pregnancy test. This blood test checks for the presence as well as the specific level of pregnancy hormone in the bloodstream.  Ultrasound. This is performed if a pregnancy test is positive. In this test, a probe is inserted into the vagina. The probe will detect a fetus, possibly in a location other than the uterus.  Taking a sample of uterus tissue (dilation and curettage, or D&C).  Surgery to perform a visual exam of the inside of the abdomen using a thin, lighted tube that has a tiny camera on the end (laparoscope).  Culdocentesis. This procedure involves inserting a needle at the top of the vagina, behind the uterus. If blood is present in this area, it may indicate that a fallopian tube is torn. How is this treated? This condition is treated with  medicine or surgery. Medicine  An injection of a medicine (methotrexate) may be given to cause the pregnancy tissue to be absorbed. This medicine may save your fallopian tube. It may be given if: ? The diagnosis is made early, with no signs of active bleeding. ? The fallopian tube has not ruptured. ? You are considered to be a good candidate for the medicine. Usually, pregnancy hormone blood levels are checked after methotrexate treatment. This is to be sure that the medicine is effective. It may take 4-6 weeks for the pregnancy to be absorbed. Most pregnancies will be absorbed by 3 weeks. Surgery  A laparoscope may be used to remove the pregnancy tissue.  If severe internal bleeding occurs, a larger cut (incision) may be made in the lower abdomen (laparotomy) to remove the fetus and placenta. This is done to stop the bleeding.  Part or all of the fallopian tube may be removed (salpingectomy) along with the fetus and placenta. The fallopian tube may also be repaired during the surgery.  In very rare circumstances, removal of the uterus (hysterectomy) may be required.  After surgery, pregnancy hormone testing may be done to be sure that there is no pregnancy tissue left. Whether your treatment is medicine or surgery, you may receive a  Rho (D) immune globulin shot to prevent problems with any future pregnancy. This shot may be given if:  You are Rh-negative and the baby's father is Rh-positive.  You are Rh-negative and you do not know the Rh type of the baby's father. Follow these instructions at home:  Rest and limit your activity after the procedure for as long as told by your health care provider.  Until your health care provider says that it is safe: ? Do not lift anything that is heavier than 10 lb (4.5 kg), or the limit that your health care provider tells you. ? Avoid physical exercise and any movement that requires effort (is strenuous).  To help prevent constipation: ? Eat a  healthy diet that includes fruits, vegetables, and whole grains. ? Drink 6-8 glasses of water per day. Get help right away if:  You develop worsening pain that is not relieved by medicine.  You have: ? A fever or chills. ? Vaginal bleeding. ? Redness and swelling at the incision site. ? Nausea and vomiting.  You feel dizzy or weak.  You feel light-headed or you faint. This information is not intended to replace advice given to you by your health care provider. Make sure you discuss any questions you have with your health care provider. Document Revised: 06/20/2017 Document Reviewed: 02/07/2016 Elsevier Patient Education  2020 [employer].

## 2019-07-26 NOTE — MAU Note (Addendum)
Pt presents to MAU due to wanting pregnancy confirmation. She had +HPT yesterday 07/25/2019. Asking for pregnancy test and [place]. She did report that she had bleeding a few days ago, today no bleeding.

## 2019-07-27 LAB — GC/CHLAMYDIA PROBE AMP (~~LOC~~) NOT AT ARMC
Chlamydia: NEGATIVE
Comment: NEGATIVE
Comment: NORMAL
Neisseria Gonorrhea: NEGATIVE

## 2019-07-27 LAB — CULTURE, OB URINE: Culture: NO GROWTH

## 2019-07-28 ENCOUNTER — Other Ambulatory Visit: Payer: Self-pay

## 2019-07-28 ENCOUNTER — Ambulatory Visit (INDEPENDENT_AMBULATORY_CARE_PROVIDER_SITE_OTHER): Payer: Medicaid Other

## 2019-07-28 DIAGNOSIS — O3680X0 Pregnancy with inconclusive fetal viability, not applicable or unspecified: Secondary | ICD-10-CM | POA: Diagnosis not present

## 2019-07-28 LAB — BETA HCG QUANT (REF LAB): hCG Quant: 499 m[IU]/mL

## 2019-07-28 NOTE — Progress Notes (Signed)
Patient seen and assessed by nursing staff during this encounter. I have reviewed the chart and agree with the documentation and plan.  Janai Brannigan, PA-C 07/28/2019 1:18 PM    

## 2019-07-28 NOTE — Progress Notes (Signed)
Pt here today for stat beta HCG following visit to MAU on 07/26/19 with abdominal pain during pregnancy; no bleeding at that time. Today pt denies any pain or bleeding. Lab drawn and pt notified to expect a follow-up phone call. Ectopic precautions reviewed with pt. Pt verbalizes understanding.  Beta HCG result is 499 today. Result and pt hx reviewed with Vonzella Nipple, PA who recommends pt have an [place] in 7-10 days for follow-up. Transvaginal US scheduled for 08/05/19 @ 0800, pt to arrive at 0745.  Called pt and notified of result and provider recommendation. [place] appt time and location given. Reviewed ectopic precautions.   Fleet Contras RN 07/28/19

## 2019-08-05 ENCOUNTER — Other Ambulatory Visit: Payer: Self-pay

## 2019-08-05 ENCOUNTER — Encounter: Payer: Self-pay | Admitting: Family Medicine

## 2019-08-05 ENCOUNTER — Ambulatory Visit (INDEPENDENT_AMBULATORY_CARE_PROVIDER_SITE_OTHER): Payer: Medicaid Other

## 2019-08-05 ENCOUNTER — Ambulatory Visit (HOSPITAL_COMMUNITY)
Admission: RE | Admit: 2019-08-05 | Discharge: 2019-08-05 | Disposition: A | Discharge status: Home / Self Care | Payer: Medicaid Other | Source: Ambulatory Visit | Attending: Medical | Admitting: Medical

## 2019-08-05 DIAGNOSIS — Z3A01 Less than 8 weeks gestation of pregnancy: Secondary | ICD-10-CM | POA: Diagnosis not present

## 2019-08-05 DIAGNOSIS — O3680X0 Pregnancy with inconclusive fetal viability, not applicable or unspecified: Secondary | ICD-10-CM | POA: Diagnosis not present

## 2019-08-05 DIAGNOSIS — Z712 Person consulting for explanation of examination or test findings: Secondary | ICD-10-CM

## 2019-08-05 DIAGNOSIS — O208 Other hemorrhage in early pregnancy: Secondary | ICD-10-CM | POA: Diagnosis not present

## 2019-08-05 NOTE — Progress Notes (Addendum)
Pt here today following [place] for results. Results reviewed with Venia Carbon, NP who states this is an IUP and ectopic pregnancy has been ruled out. Rasch, NP states this appears to be a normal early pregnancy and pt should begin prenatal care. US shows pt to be [redacted]w[redacted]d today. Rasch, NP states pt may experience some spotting or Brunner discharge due to small subchorionic hemorrhage.  Results and provider recommendation given to pt. Medications and allergies reviewed with pt; list of medications safe to take during pregnancy given. Encouraged pt to begin prenatal vitamins. Pt expresses interest in receiving prenatal care in our office. Front office notified to schedule new OB appts and provide proof of pregnancy letter.   Fleet Contras RN 08/05/19

## 2019-08-06 NOTE — Progress Notes (Signed)
Chart reviewed for nurse visit. Agree with plan of care.   Venia Carbon I, NP 08/06/2019 10:24 AM

## 2019-09-02 ENCOUNTER — Encounter: Payer: Self-pay | Admitting: Obstetrics & Gynecology

## 2019-09-02 ENCOUNTER — Telehealth: Payer: Self-pay | Admitting: Obstetrics & Gynecology

## 2019-09-02 ENCOUNTER — Ambulatory Visit: Payer: Medicaid Other | Admitting: *Deleted

## 2019-09-02 DIAGNOSIS — Z3490 Encounter for supervision of normal pregnancy, unspecified, unspecified trimester: Secondary | ICD-10-CM

## 2019-09-02 NOTE — Telephone Encounter (Signed)
Called the patient to reschedule the appointment. The line received the call however no one came on the line. After repeating hello 4x the call was disconnected. The patient will be mailed an missed appointment letter and the upcoming new ob appointment will be cancelled due to missed nurse intake and no point of contact.

## 2019-09-02 NOTE — Progress Notes (Signed)
1282 I called Elfreda for her telephone visit and heard a  Message "the person you are calling does not have a voicemail set up." Oliviana Mcgahee,RN  [phone] I called Shariyah again for her telephone visit and again heard a message " the person you are calling does not have a voicemail set up. " I called her contact Tammy home number and also heard the message " the person you are calling does not have a voicemail set up" I also called Tammy's mobile number and she answered . I asked if Kisa was there and she stated no. I asked if she has another contact number and it was the same number I have. I asked if she will let Edwardine know we were trying to reach her for a telephone visit and to call us please.  Dorien Mayotte,RN

## 2019-09-09 ENCOUNTER — Encounter: Payer: Medicaid Other | Admitting: Obstetrics and Gynecology

## 2019-09-13 ENCOUNTER — Other Ambulatory Visit: Payer: Self-pay

## 2019-09-13 ENCOUNTER — Encounter (HOSPITAL_COMMUNITY): Payer: Self-pay

## 2019-09-13 ENCOUNTER — Ambulatory Visit (HOSPITAL_COMMUNITY)
Admission: EM | Admit: 2019-09-13 | Discharge: 2019-09-13 | Disposition: A | Discharge status: Home / Self Care | Payer: Medicaid Other | Attending: Physician Assistant | Admitting: Physician Assistant

## 2019-09-13 DIAGNOSIS — L02412 Cutaneous abscess of left axilla: Secondary | ICD-10-CM

## 2019-09-13 MED ORDER — LIDOCAINE HCL (PF) 1 % IJ SOLN
INTRAMUSCULAR | Status: AC
  Filled 2019-09-13: qty 2

## 2019-09-13 MED ORDER — SULFAMETHOXAZOLE-TRIMETHOPRIM 800-160 MG PO TABS: 1.0000 | ORAL_TABLET | Freq: Two times a day (BID) | ORAL | 0 refills | Status: DC

## 2019-09-13 NOTE — Discharge Instructions (Signed)
Return if any problems.

## 2019-09-13 NOTE — ED Triage Notes (Signed)
Patient presents to Urgent Care with complaints of abscess under her left axilla since two weeks ago but worse the past 2-3 nights. Patient reports it is not open or draining, pt laying in the fetal position during triage.

## 2019-09-13 NOTE — ED Provider Notes (Signed)
[facility]    CSN: [id number] Arrival date & time: 09/13/19  1911      History   Chief Complaint Chief Complaint  Patient presents with  . Abscess    HPI Catherine Morales is a 29 y.o. female.   The history is provided by the patient. No language interpreter was used.  Abscess Location:  Shoulder/arm Size:  2 Abscess quality: painful and redness   Progression:  Worsening Pain details:    Severity:  Moderate   Timing:  Constant   Progression:  Worsening Chronicity:  New Relieved by:  Nothing Worsened by:  Nothing Ineffective treatments:  None tried Associated symptoms: no nausea     Past Medical History:  Diagnosis Date  . BV (bacterial vaginosis)   . Chlamydia   . HPV in female   . No pertinent past medical history   . Scoliosis     Patient Active Problem List   Diagnosis Date Noted  . Abnormal uterine bleeding 01/29/2018  . 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 Bacterial vaginitis 05/12/2017  . Chronic low back pain 04/21/2015  . Preventative health care 04/05/2015  . Pyelonephritis 04/02/2015  . 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 UNSPECIFIED VAGINITIS AND VULVOVAGINITIS 08/08/2010    Past Surgical History:  Procedure Laterality Date  . WISDOM TOOTH EXTRACTION      OB History    Gravida  2   Para  1   Term  1   Preterm      AB      Living  1     SAB      TAB      Ectopic      Multiple      Live Births           Obstetric Comments  Age 59 with first baby         Home Medications    Prior to Admission medications   Medication Sig Start Date End Date Taking? Authorizing Provider  acetaminophen (TYLENOL) 325 MG tablet Take 2 tablets (650 mg total) by mouth every 6 (six) hours as needed. 08/11/17   Derwood Kaplan, MD  sulfamethoxazole-trimethoprim (BACTRIM DS) 800-160 MG tablet Take 1 tablet by mouth 2 (two) times daily. 09/13/19   Elson Areas, PA-C    Family History Family History  Problem  Relation Age of Onset  . Cancer Maternal Grandmother   . Cancer Paternal Grandmother   . Healthy Mother   . Healthy Father     Social History Social History   Tobacco Use  . Smoking status: Current Every Day Smoker    Packs/day: 0.30    Types: Cigarettes  . Smokeless tobacco: Never Used  Substance Use Topics  . Alcohol use: Not Currently    Alcohol/week: 0.0 standard drinks    Comment: socially   . Drug use: No     Allergies   Patient has no known allergies.   Review of Systems Review of Systems  Gastrointestinal: Negative for nausea.  All other systems reviewed and are negative.    Physical Exam Triage Vital Signs ED Triage Vitals  Enc Vitals Group     BP 09/13/19 1950 134/87     Pulse Rate 09/13/19 1950 84     Resp 09/13/19 1950 16     Temp 09/13/19 1950 99.5 F (37.5 C)     Temp Source 09/13/19 1950 Oral     SpO2 09/13/19 1950 100 %     Weight --  Height --      Head Circumference --      Peak Flow --      Pain Score 09/13/19 1949 10     Pain Loc --      Pain Edu? --      Excl. in GC? --    No data found.  Updated Vital Signs BP 134/87 (BP Location: Right Arm)   Pulse 84   Temp 99.5 F (37.5 C) (Oral)   Resp 16   LMP 06/16/2019   SpO2 100%   Breastfeeding Unknown Comment: recent abortion  Visual Acuity Right Eye Distance:   Left Eye Distance:   Bilateral Distance:    Right Eye Near:   Left Eye Near:    Bilateral Near:     Physical Exam Vitals and nursing note reviewed.  Constitutional:      Appearance: She is well-developed.  HENT:     Head: Normocephalic.  Cardiovascular:     Rate and Rhythm: Normal rate.  Pulmonary:     Effort: Pulmonary effort is normal.  Abdominal:     General: There is no distension.  Musculoskeletal:        General: Normal range of motion.     Cervical back: Normal range of motion.  Skin:    General: Skin is warm.     Comments: 3cm abscess left axilla  Neurological:     Mental Status: She is  alert and oriented to person, place, and time.      UC Treatments / Results  Labs (all labs ordered are listed, but only abnormal results are displayed) Labs Reviewed - No data to display  EKG   Radiology No results found.  Procedures Incision and Drainage  Date/Time: 09/13/2019 8:21 PM Performed by: Elson Areas, PA-C Authorized by: Elson Areas, PA-C   Consent:    Consent obtained:  Verbal   Consent given by:  Patient   Risks discussed:  Bleeding, incomplete drainage, pain and damage to other organs   Alternatives discussed:  No treatment Universal protocol:    Procedure explained and questions answered to patient or proxy's satisfaction: yes     Relevant documents present and verified: yes     Test results available and properly labeled: yes     Imaging studies available: yes     Required blood products, implants, devices, and special equipment available: yes     Site/side marked: yes     Immediately prior to procedure a time out was called: yes     Patient identity confirmed:  Verbally with patient Location:    Type:  Abscess   Size:  3   Location:  Upper extremity Pre-procedure details:    Skin preparation:  Betadine Anesthesia (see MAR for exact dosages):    Anesthesia method:  Local infiltration   Local anesthetic:  Lidocaine 1% WITH epi Procedure type:    Complexity:  Complex Procedure details:    Incision types:  Single straight   Incision depth:  Subcutaneous   Scalpel blade:  11   Wound management:  Probed and deloculated, irrigated with saline and extensive cleaning   Drainage:  Purulent   Drainage amount:  Moderate Post-procedure details:    Patient tolerance of procedure:  Tolerated well, no immediate complications   (including critical care time)  Medications Ordered in UC Medications - No data to display  Initial Impression / Assessment and Plan / UC Course  I have reviewed the triage vital signs and the nursing  notes.  Pertinent  labs & imaging results that were available during my care of the patient were reviewed by me and considered in my medical decision making (see chart for details).     MDM  Pt given rx for bactrim.  Pt counseled on wound care Final Clinical Impressions(s) / UC Diagnoses   Final diagnoses:  Abscess of left axilla     Discharge Instructions     Return if any problems.    ED Prescriptions    Medication Sig Dispense Auth. Provider   sulfamethoxazole-trimethoprim (BACTRIM DS) 800-160 MG tablet Take 1 tablet by mouth 2 (two) times daily. 20 tablet Fransico Meadow, [place]     PDMP not reviewed this encounter.   Fransico Meadow, [place] 09/13/19 2022

## 2020-01-17 DIAGNOSIS — Z23 Encounter for immunization: Secondary | ICD-10-CM | POA: Diagnosis not present

## 2020-03-23 ENCOUNTER — Encounter (HOSPITAL_COMMUNITY): Payer: Self-pay | Admitting: Emergency Medicine

## 2020-03-23 ENCOUNTER — Ambulatory Visit (HOSPITAL_COMMUNITY)
Admission: EM | Admit: 2020-03-23 | Discharge: 2020-03-23 | Disposition: A | Discharge status: Home / Self Care | Payer: Medicaid Other | Attending: Family Medicine | Admitting: Family Medicine

## 2020-03-23 ENCOUNTER — Other Ambulatory Visit: Payer: Self-pay

## 2020-03-23 DIAGNOSIS — N760 Acute vaginitis: Secondary | ICD-10-CM | POA: Insufficient documentation

## 2020-03-23 DIAGNOSIS — Z20822 Contact with and (suspected) exposure to covid-19: Secondary | ICD-10-CM

## 2020-03-23 LAB — SARS CORONAVIRUS 2 (TAT 6-24 HRS): SARS Coronavirus 2: NEGATIVE

## 2020-03-23 MED ORDER — FLUCONAZOLE 150 MG PO TABS: 150.0000 mg | ORAL_TABLET | Freq: Every day | ORAL | 0 refills | Status: DC

## 2020-03-23 NOTE — ED Triage Notes (Signed)
PT reports vaginal discharge, itching, irritation. History of yeast infections.

## 2020-03-23 NOTE — ED Triage Notes (Signed)
Requests COVID test, no symptoms, has been vaccinated, friend has COVID.

## 2020-03-23 NOTE — Discharge Instructions (Addendum)
Treating you for yeast infection Covid swab pending.  Vaginal swab pending.

## 2020-03-23 NOTE — ED Provider Notes (Signed)
[facility]    CSN: [id number] Arrival date & time: 03/23/20  3536      History   Chief Complaint Chief Complaint  Patient presents with  . Vaginitis    HPI Catherine Morales is a 29 y.o. female.   Patient is a 29 year old female past medical history of BV, chlamydia, HPV.  She presents today with vaginal discharge, itching and irritation.  Reporting history of yeast infections and feels similar.  Would also like to be checked for STDs.  She was also exposed to Covid but does not have any Covid symptoms but wants to be tested. Patient's last menstrual period was 02/21/2020.      Past Medical History:  Diagnosis Date  . BV (bacterial vaginosis)   . Chlamydia   . HPV in female   . No pertinent past medical history   . Scoliosis     Patient Active Problem List   Diagnosis Date Noted  . Abnormal uterine bleeding 01/29/2018  . 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 Bacterial vaginitis 05/12/2017  . Chronic low back pain 04/21/2015  . Preventative health care 04/05/2015  . Pyelonephritis 04/02/2015  . 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 UNSPECIFIED VAGINITIS AND VULVOVAGINITIS 08/08/2010    Past Surgical History:  Procedure Laterality Date  . WISDOM TOOTH EXTRACTION      OB History    Gravida  2   Para  1   Term  1   Preterm      AB      Living  1     SAB      TAB      Ectopic      Multiple      Live Births           Obstetric Comments  Age 79 with first baby         Home Medications    Prior to Admission medications   Medication Sig Start Date End Date Taking? Authorizing Provider  acetaminophen (TYLENOL) 325 MG tablet Take 2 tablets (650 mg total) by mouth every 6 (six) hours as needed. 08/11/17   Derwood Kaplan, MD  fluconazole (DIFLUCAN) 150 MG tablet Take 1 tablet (150 mg total) by mouth daily. 03/23/20   Dahlia Byes A, NP  sulfamethoxazole-trimethoprim (BACTRIM DS) 800-160 MG tablet Take 1 tablet by mouth 2  (two) times daily. 09/13/19   Elson Areas, PA-C    Family History Family History  Problem Relation Age of Onset  . Cancer Maternal Grandmother   . Cancer Paternal Grandmother   . Healthy Mother   . Healthy Father     Social History Social History   Tobacco Use  . Smoking status: Current Every Day Smoker    Packs/day: 0.30    Types: Cigarettes  . Smokeless tobacco: Never Used  Vaping Use  . Vaping Use: Never used  Substance Use Topics  . Alcohol use: Not Currently    Alcohol/week: 0.0 standard drinks    Comment: socially   . Drug use: No     Allergies   Patient has no known allergies.   Review of Systems Review of Systems   Physical Exam Triage Vital Signs ED Triage Vitals  Enc Vitals Group     BP 03/23/20 0839 113/61     Pulse Rate 03/23/20 0839 62     Resp 03/23/20 0839 16     Temp 03/23/20 0839 98.7 F (37.1 C)     Temp Source 03/23/20 0839 Oral  SpO2 03/23/20 0839 98 %     Weight --      Height --      Head Circumference --      Peak Flow --      Pain Score 03/23/20 0838 2     Pain Loc --      Pain Edu? --      Excl. in GC? --    No data found.  Updated Vital Signs BP 113/61   Pulse 62   Temp 98.7 F (37.1 C) (Oral)   Resp 16   LMP 02/21/2020   SpO2 98%   Visual Acuity Right Eye Distance:   Left Eye Distance:   Bilateral Distance:    Right Eye Near:   Left Eye Near:    Bilateral Near:     Physical Exam Vitals and nursing note reviewed.  Constitutional:      General: She is not in acute distress.    Appearance: Normal appearance. She is not ill-appearing, toxic-appearing or diaphoretic.  HENT:     Head: Normocephalic.     Nose: Nose normal.  Eyes:     Conjunctiva/sclera: Conjunctivae normal.  Pulmonary:     Effort: Pulmonary effort is normal.  Musculoskeletal:        General: Normal range of motion.     Cervical back: Normal range of motion.  Skin:    General: Skin is warm and dry.     Findings: No rash.    Neurological:     Mental Status: She is alert.  Psychiatric:        Mood and Affect: Mood normal.      UC Treatments / Results  Labs (all labs ordered are listed, but only abnormal results are displayed) Labs Reviewed  SARS CORONAVIRUS 2 (TAT 6-24 HRS)  CERVICOVAGINAL ANCILLARY ONLY    EKG   Radiology No results found.  Procedures Procedures (including critical care time)  Medications Ordered in UC Medications - No data to display  Initial Impression / Assessment and Plan / UC Course  I have reviewed the triage vital signs and the nursing notes.  Pertinent labs & imaging results that were available during my care of the patient were reviewed by me and considered in my medical decision making (see chart for details).     Cough suspected to Covid without any symptoms Covid swab pending  Vaginitis Treating for yeast infection based on symptoms. Swab sent for testing  Follow up as needed for continued or worsening symptoms Final Clinical Impressions(s) / UC Diagnoses   Final diagnoses:  Close exposure to COVID-19 virus  Vaginitis and vulvovaginitis     Discharge Instructions     Treating you for yeast infection Covid swab pending.  Vaginal swab pending.     ED Prescriptions    Medication Sig Dispense Auth. Provider   fluconazole (DIFLUCAN) 150 MG tablet Take 1 tablet (150 mg total) by mouth daily. 2 tablet Dahlia Byes A, NP     PDMP not reviewed this encounter.   Janace Aris, NP 03/23/20 [phone]

## 2020-03-24 ENCOUNTER — Telehealth (HOSPITAL_COMMUNITY): Payer: Self-pay

## 2020-03-24 LAB — CERVICOVAGINAL ANCILLARY ONLY
Bacterial Vaginitis (gardnerella): POSITIVE — AB
Candida Glabrata: NEGATIVE
Candida Vaginitis: POSITIVE — AB
Chlamydia: NEGATIVE
Comment: NEGATIVE
Comment: NEGATIVE
Comment: NEGATIVE
Comment: NEGATIVE
Comment: NEGATIVE
Comment: NORMAL
Neisseria Gonorrhea: NEGATIVE
Trichomonas: NEGATIVE

## 2020-03-24 MED ORDER — METRONIDAZOLE 500 MG PO TABS: 500.0000 mg | ORAL_TABLET | Freq: Two times a day (BID) | ORAL | 0 refills | Status: DC

## 2020-04-14 ENCOUNTER — Encounter (HOSPITAL_COMMUNITY): Payer: Self-pay

## 2020-04-14 ENCOUNTER — Other Ambulatory Visit: Payer: Self-pay

## 2020-04-14 ENCOUNTER — Ambulatory Visit (HOSPITAL_COMMUNITY)
Admission: EM | Admit: 2020-04-14 | Discharge: 2020-04-14 | Disposition: A | Discharge status: Home / Self Care | Payer: Medicaid Other | Attending: Internal Medicine | Admitting: Internal Medicine

## 2020-04-14 ENCOUNTER — Telehealth (HOSPITAL_COMMUNITY): Payer: Self-pay

## 2020-04-14 DIAGNOSIS — Z3202 Encounter for pregnancy test, result negative: Secondary | ICD-10-CM | POA: Diagnosis not present

## 2020-04-14 DIAGNOSIS — N760 Acute vaginitis: Secondary | ICD-10-CM | POA: Diagnosis not present

## 2020-04-14 LAB — POCT URINALYSIS DIPSTICK, ED / UC
Bilirubin Urine: NEGATIVE
Glucose, UA: NEGATIVE mg/dL
Hgb urine dipstick: NEGATIVE
Ketones, ur: NEGATIVE mg/dL
Leukocytes,Ua: NEGATIVE
Nitrite: NEGATIVE
Protein, ur: NEGATIVE mg/dL
Specific Gravity, Urine: >=1.03 (ref 1.005–1.030)
Urobilinogen, UA: 0.2 mg/dL (ref 0.0–1.0)
pH: 6 (ref 5.0–8.0)

## 2020-04-14 LAB — POC URINE PREG, ED: Preg Test, Ur: NEGATIVE

## 2020-04-14 MED ORDER — FLUCONAZOLE 150 MG PO TABS: 150.0000 mg | ORAL_TABLET | Freq: Every day | ORAL | 0 refills | Status: DC

## 2020-04-14 MED ORDER — METRONIDAZOLE 500 MG PO TABS: 500.0000 mg | ORAL_TABLET | Freq: Two times a day (BID) | ORAL | 0 refills | Status: DC

## 2020-04-14 NOTE — ED Provider Notes (Signed)
[facility]    CSN: [id number] Arrival date & time: 04/14/20  1013      History   Chief Complaint Chief Complaint  Patient presents with   Vaginitis   Possible Pregnancy    HPI Catherine Morales is a 29 y.o. female.   Patient is a 29 year old female past medical history of BV, chlamydia, HPV.  She presents today for vaginal irritation, mild discharge.  Concern for BV and possible yeast.  Denies any specific odor to the discharge.  Also would like pregnancy test. Patient's last menstrual period was 02/08/2019. No abdominal pain, back pain, dysuria, hematuria or urinary frequency.     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 Authorizing Provider  acetaminophen (TYLENOL) 325 MG tablet Take 2 tablets (650 mg total) by mouth every 6 (six) hours as needed. 08/11/17   Derwood Kaplan, MD  fluconazole (DIFLUCAN) 150 MG tablet Take 1 tablet (150 mg total) by mouth daily. 04/14/20   Dahlia Byes A, NP  metroNIDAZOLE (FLAGYL) 500 MG tablet Take 1 tablet (500 mg total) by mouth 2  (two) times daily. 04/14/20   Janace Aris, NP    Family History Family History  Problem Relation Age of Onset   Cancer Maternal Grandmother    Cancer Paternal Grandmother    Healthy Mother    Healthy Father     Social History Social History   Tobacco Use   Smoking status: Current Every Day Smoker    Packs/day: 0.30    Types: Cigarettes   Smokeless tobacco: Never Used  Vaping Use   Vaping Use: Never used  Substance Use Topics   Alcohol use: Not Currently    Alcohol/week: 0.0 standard drinks    Comment: socially    Drug use: No     Allergies   Patient has no known allergies.   Review of Systems Review of Systems   Physical Exam Triage Vital Signs ED Triage Vitals  Enc Vitals Group     BP 04/14/20 1130 118/77     Pulse Rate 04/14/20 1130 75     Resp 04/14/20 1130 18     Temp 04/14/20 1130 98.5 F (36.9 C)     Temp Source 04/14/20 1130 Oral  SpO2 04/14/20 1130 100 %     Weight --      Height --      Head Circumference --      Peak Flow --      Pain Score 04/14/20 1135 2     Pain Loc --      Pain Edu? --      Excl. in GC? --    No data found.  Updated Vital Signs BP 118/77 (BP Location: Right Arm)    Pulse 75    Temp 98.5 F (36.9 C) (Oral)    Resp 18    LMP 02/08/2019    SpO2 100%   Visual Acuity Right Eye Distance:   Left Eye Distance:   Bilateral Distance:    Right Eye Near:   Left Eye Near:    Bilateral Near:     Physical Exam Vitals and nursing note reviewed.  Constitutional:      General: She is not in acute distress.    Appearance: Normal appearance. She is not ill-appearing, toxic-appearing or diaphoretic.  HENT:     Head: Normocephalic.     Nose: Nose normal.  Eyes:     Conjunctiva/sclera: Conjunctivae normal.  Pulmonary:     Effort: Pulmonary effort is normal.  Musculoskeletal:        General: Normal range of motion.     Cervical back: Normal range of motion.  Skin:    General: Skin is warm and dry.      Findings: No rash.  Neurological:     Mental Status: She is alert.  Psychiatric:        Mood and Affect: Mood normal.      UC Treatments / Results  Labs (all labs ordered are listed, but only abnormal results are displayed) Labs Reviewed  POCT URINALYSIS DIPSTICK, ED / UC  POC URINE PREG, ED  CERVICOVAGINAL ANCILLARY ONLY    EKG   Radiology No results found.  Procedures Procedures (including critical care time)  Medications Ordered in UC Medications - No data to display  Initial Impression / Assessment and Plan / UC Course  I have reviewed the triage vital signs and the nursing notes.  Pertinent labs & imaging results that were available during my care of the patient were reviewed by me and considered in my medical decision making (see chart for details).     Vaginal discharge.  Patient requesting being treated for BV and yeast based on history and symptoms. Swab sent for testing Pregnancy test negative.  Recommended follow-up with OB/GYN Final Clinical Impressions(s) / UC Diagnoses   Final diagnoses:  Vaginitis and vulvovaginitis  Negative pregnancy test   Discharge Instructions   None    ED Prescriptions    Medication Sig Dispense Auth. Provider   metroNIDAZOLE (FLAGYL) 500 MG tablet Take 1 tablet (500 mg total) by mouth 2 (two) times daily. 14 tablet Natelie Ostrosky A, NP   fluconazole (DIFLUCAN) 150 MG tablet Take 1 tablet (150 mg total) by mouth daily. 2 tablet Dahlia Byes A, NP     PDMP not reviewed this encounter.   Janace Aris, NP 04/14/20 1540

## 2020-04-14 NOTE — ED Triage Notes (Signed)
Pt presents with vaginal irritation and discharge.  Pt also presents for pregnancy test after 2 missed periods

## 2020-04-17 LAB — CERVICOVAGINAL ANCILLARY ONLY
Bacterial Vaginitis (gardnerella): POSITIVE — AB
Candida Glabrata: NEGATIVE
Candida Vaginitis: POSITIVE — AB
Chlamydia: NEGATIVE
Comment: NEGATIVE
Comment: NEGATIVE
Comment: NEGATIVE
Comment: NEGATIVE
Comment: NEGATIVE
Comment: NORMAL
Neisseria Gonorrhea: NEGATIVE
Trichomonas: NEGATIVE

## 2020-04-23 ENCOUNTER — Encounter (HOSPITAL_COMMUNITY): Payer: Self-pay

## 2020-04-23 ENCOUNTER — Other Ambulatory Visit: Payer: Self-pay

## 2020-04-23 ENCOUNTER — Ambulatory Visit (HOSPITAL_COMMUNITY)
Admission: EM | Admit: 2020-04-23 | Discharge: 2020-04-23 | Disposition: A | Discharge status: Home / Self Care | Payer: Medicaid Other | Attending: Emergency Medicine | Admitting: Emergency Medicine

## 2020-04-23 DIAGNOSIS — R0602 Shortness of breath: Secondary | ICD-10-CM | POA: Insufficient documentation

## 2020-04-23 DIAGNOSIS — B349 Viral infection, unspecified: Secondary | ICD-10-CM | POA: Diagnosis not present

## 2020-04-23 DIAGNOSIS — R509 Fever, unspecified: Secondary | ICD-10-CM | POA: Diagnosis present

## 2020-04-23 DIAGNOSIS — R0989 Other specified symptoms and signs involving the circulatory and respiratory systems: Secondary | ICD-10-CM | POA: Insufficient documentation

## 2020-04-23 DIAGNOSIS — Z20822 Contact with and (suspected) exposure to covid-19: Secondary | ICD-10-CM | POA: Diagnosis not present

## 2020-04-23 DIAGNOSIS — F17210 Nicotine dependence, cigarettes, uncomplicated: Secondary | ICD-10-CM | POA: Diagnosis not present

## 2020-04-23 MED ORDER — IBUPROFEN 800 MG PO TABS: 800.0000 mg | ORAL_TABLET | Freq: Three times a day (TID) | ORAL | 0 refills | Status: DC

## 2020-04-23 MED ORDER — BENZONATATE 200 MG PO CAPS: 200.0000 mg | ORAL_CAPSULE | Freq: Three times a day (TID) | ORAL | 0 refills | Status: AC | PRN

## 2020-04-23 MED ORDER — ONDANSETRON 4 MG PO TBDP: 4.0000 mg | ORAL_TABLET | Freq: Three times a day (TID) | ORAL | 0 refills | Status: DC | PRN

## 2020-04-23 NOTE — ED Provider Notes (Signed)
[facility]    CSN: [id number] Arrival date & time: 04/23/20  1152      History   Chief Complaint Chief Complaint  Patient presents with   Fever   Chills   Emesis    HPI RESHA FILIPPONE is a 29 y.o. female presenting today for evaluation of fevers chills and body aches.  Patient reports that beginning Thursday she began to feel chills achy and has noted fevers up to 102.  She does report some chest congestion and slight shortness of breath.  Reports her niece and nephew have been sick with similar symptoms, but no known Covid exposures.  Using NyQuil Mucinex and Tylenol PM without relief.    HPI  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   Bacterial vaginitis 05/12/2017   Chronic low back pain 04/21/2015   Preventative health care 04/05/2015   Pyelonephritis 04/02/2015   CHLAMYDTRACHOMATIS INFECTION LOWER GU SITES 08/15/2010   UNSPECIFIED VAGINITIS AND VULVOVAGINITIS 08/08/2010    Past Surgical History:  Procedure Laterality Date   WISDOM TOOTH EXTRACTION      OB History    Gravida  2   Para  1   Term  1   Preterm      AB      Living  1     SAB      TAB      Ectopic      Multiple      Live Births           Obstetric Comments  Age 47 with first baby         Home Medications    Prior to Admission medications   Medication Sig Start Date End Date Taking? Authorizing Provider  acetaminophen (TYLENOL) 325 MG tablet Take 2 tablets (650 mg total) by mouth every 6 (six) hours as needed. 08/11/17   Derwood Kaplan, MD  benzonatate (TESSALON) 200 MG capsule Take 1 capsule (200 mg total) by mouth 3 (three) times daily as needed for up to 7 days for cough. 04/23/20 04/30/20  Laquiesha Piacente C,  PA-C  ibuprofen (ADVIL) 800 MG tablet Take 1 tablet (800 mg total) by mouth 3 (three) times daily. 04/23/20   Kiven Vangilder C, PA-C  ondansetron (ZOFRAN ODT) 4 MG disintegrating tablet Take 1 tablet (4 mg total) by mouth every 8 (eight) hours as needed for nausea or vomiting. 04/23/20   Sachi Boulay, Junius Creamer, PA-C    Family History Family History  Problem Relation Age of Onset   Cancer Maternal Grandmother    Cancer Paternal Grandmother    Healthy Mother    Healthy Father     Social History Social History   Tobacco Use   Smoking status: Current Every Day Smoker    Packs/day: 0.30    Types: Cigarettes   Smokeless tobacco: Never Used  Vaping Use   Vaping Use: Never used  Substance Use Topics   Alcohol use: Not Currently    Alcohol/week: 0.0 standard drinks    Comment: socially    Drug use: No     Allergies   Patient has no known allergies.   Review of Systems Review of Systems  Constitutional: Positive for appetite change,  chills, fatigue and fever. Negative for activity change.  HENT: Positive for congestion. Negative for ear pain, rhinorrhea, sinus pressure, sore throat and trouble swallowing.   Eyes: Negative for discharge and redness.  Respiratory: Positive for cough. Negative for chest tightness and shortness of breath.   Cardiovascular: Negative for chest pain.  Gastrointestinal: Positive for diarrhea, nausea and vomiting. Negative for abdominal pain.  Musculoskeletal: Negative for myalgias.  Skin: Negative for rash.  Neurological: Positive for headaches. Negative for dizziness and light-headedness.     Physical Exam Triage Vital Signs ED Triage Vitals  Enc Vitals Group     BP 04/23/20 1343 110/61     Pulse Rate 04/23/20 1340 94     Resp 04/23/20 1340 18     Temp 04/23/20 1340 98.1 F (36.7 C)     Temp Source 04/23/20 1340 Oral     SpO2 04/23/20 1340 95 %     Weight --      Height --      Head Circumference --      Peak Flow --      Pain Score  04/23/20 1340 0     Pain Loc --      Pain Edu? --      Excl. in GC? --    No data found.  Updated Vital Signs BP 110/61    Pulse 94    Temp 98.1 F (36.7 C) (Oral)    Resp 18    LMP 04/17/2020 (Approximate)    SpO2 95%   Visual Acuity Right Eye Distance:   Left Eye Distance:   Bilateral Distance:    Right Eye Near:   Left Eye Near:    Bilateral Near:     Physical Exam Vitals and nursing note reviewed.  Constitutional:      Appearance: She is well-developed.     Comments: No acute distress  HENT:     Head: Normocephalic and atraumatic.     Ears:     Comments: Bilateral ears without tenderness to palpation of external auricle, tragus and mastoid, EAC's without erythema or swelling, TM's with good bony landmarks and cone of light. Non erythematous.     Nose: Nose normal.     Mouth/Throat:     Comments: Oral mucosa pink and moist, no tonsillar enlargement or exudate. Posterior pharynx patent and nonerythematous, no uvula deviation or swelling. Normal phonation. Eyes:     Conjunctiva/sclera: Conjunctivae normal.  Cardiovascular:     Rate and Rhythm: Normal rate.  Pulmonary:     Effort: Pulmonary effort is normal. No respiratory distress.     Comments: Breathing comfortably at rest, CTABL, no wheezing, rales or other adventitious sounds auscultated Abdominal:     General: There is no distension.     Comments: Soft, nondistended, nontender to light deep palpation throughout abdomen  Musculoskeletal:        General: Normal range of motion.     Cervical back: Neck supple.  Skin:    General: Skin is warm and dry.  Neurological:     Mental Status: She is alert and oriented to person, place, and time.      UC Treatments / Results  Labs (all labs ordered are listed, but only abnormal results are displayed) Labs Reviewed  SARS CORONAVIRUS 2 (TAT 6-24 HRS)    EKG   Radiology No results found.  Procedures Procedures (including critical care time)  Medications  Ordered in UC Medications - No data to display  Initial Impression /  Assessment and Plan / UC Course  I have reviewed the triage vital signs and the nursing notes.  Pertinent labs & imaging results that were available during my care of the patient were reviewed by me and considered in my medical decision making (see chart for details).     Covid test pending, URI symptoms and GI symptoms, exam reassuring, suspect likely viral etiology and recommending continued symptomatic and supportive care with close monitoring.  Rest and fluids.  Discussed strict return precautions. Patient verbalized understanding and is agreeable with plan.  Final Clinical Impressions(s) / UC Diagnoses   Final diagnoses:  Viral illness     Discharge Instructions     Covid test pending Zofran for nausea and vomiting Tylenol and ibuprofen for fevers body aches headache Continue Mucinex Tessalon for cough as needed Rest and fluids Follow-up if not improving or worsening    ED Prescriptions    Medication Sig Dispense Auth. Provider   ondansetron (ZOFRAN ODT) 4 MG disintegrating tablet Take 1 tablet (4 mg total) by mouth every 8 (eight) hours as needed for nausea or vomiting. 20 tablet Anselm Aumiller C, PA-C   benzonatate (TESSALON) 200 MG capsule Take 1 capsule (200 mg total) by mouth 3 (three) times daily as needed for up to 7 days for cough. 28 capsule Naven Giambalvo C, PA-C   ibuprofen (ADVIL) 800 MG tablet Take 1 tablet (800 mg total) by mouth 3 (three) times daily. 21 tablet Adana Marik, [place] C, PA-C     PDMP not reviewed this encounter.   Lew Dawes, PA-C 04/24/20 1128

## 2020-04-23 NOTE — Discharge Instructions (Signed)
Covid test pending Zofran for nausea and vomiting Tylenol and ibuprofen for fevers body aches headache Continue Mucinex Tessalon for cough as needed Rest and fluids Follow-up if not improving or worsening

## 2020-04-23 NOTE — ED Triage Notes (Signed)
Pt in with c/o vomiting, diarrhea, runny nose , congestion and fever with chillsTtmax of 102. sxs started Thursday. Pt took mucinex, nyquil, and tylenol pm, last taken last night with some relief Received both doses of covid vaccine Denies sob, cp, or other sxs

## 2020-04-24 LAB — SARS CORONAVIRUS 2 (TAT 6-24 HRS): SARS Coronavirus 2: NEGATIVE

## 2020-04-28 DIAGNOSIS — F99 Mental disorder, not otherwise specified: Secondary | ICD-10-CM | POA: Diagnosis not present

## 2020-05-11 ENCOUNTER — Encounter (HOSPITAL_COMMUNITY): Payer: Self-pay

## 2020-05-11 ENCOUNTER — Other Ambulatory Visit: Payer: Self-pay

## 2020-05-11 ENCOUNTER — Ambulatory Visit (HOSPITAL_COMMUNITY)
Admission: EM | Admit: 2020-05-11 | Discharge: 2020-05-11 | Disposition: A | Discharge status: Home / Self Care | Payer: Medicaid Other | Attending: Family Medicine | Admitting: Family Medicine

## 2020-05-11 DIAGNOSIS — N390 Urinary tract infection, site not specified: Secondary | ICD-10-CM | POA: Diagnosis not present

## 2020-05-11 DIAGNOSIS — R300 Dysuria: Secondary | ICD-10-CM | POA: Diagnosis not present

## 2020-05-11 DIAGNOSIS — B009 Herpesviral infection, unspecified: Secondary | ICD-10-CM | POA: Diagnosis not present

## 2020-05-11 DIAGNOSIS — A6000 Herpesviral infection of urogenital system, unspecified: Secondary | ICD-10-CM

## 2020-05-11 HISTORY — DX: Herpesviral infection of urogenital system, unspecified: A60.00

## 2020-05-11 LAB — POCT URINALYSIS DIPSTICK, ED / UC
Bilirubin Urine: NEGATIVE
Glucose, UA: NEGATIVE mg/dL
Ketones, ur: NEGATIVE mg/dL
Nitrite: NEGATIVE
Protein, ur: >=300 mg/dL — AB
Specific Gravity, Urine: 1.025 (ref 1.005–1.030)
Urobilinogen, UA: 0.2 mg/dL (ref 0.0–1.0)
pH: 8.5 — ABNORMAL HIGH (ref 5.0–8.0)

## 2020-05-11 MED ORDER — VALACYCLOVIR HCL 1 G PO TABS: 1000.0000 mg | ORAL_TABLET | Freq: Three times a day (TID) | ORAL | 0 refills | Status: AC

## 2020-05-11 MED ORDER — FLUCONAZOLE 150 MG PO TABS: 150.0000 mg | ORAL_TABLET | Freq: Once | ORAL | 0 refills | Status: AC

## 2020-05-11 MED ORDER — LIDOCAINE VISCOUS HCL 2 % MT SOLN: OROMUCOSAL | 0 refills | Status: DC

## 2020-05-11 MED ORDER — IBUPROFEN 800 MG PO TABS: 800.0000 mg | ORAL_TABLET | Freq: Three times a day (TID) | ORAL | 0 refills | Status: DC

## 2020-05-11 MED ORDER — NITROFURANTOIN MONOHYD MACRO 100 MG PO CAPS: 100.0000 mg | ORAL_CAPSULE | Freq: Two times a day (BID) | ORAL | 0 refills | Status: AC

## 2020-05-11 NOTE — ED Notes (Signed)
Patient unable to give urine specimen at this time.  Will try again after drinking some water.

## 2020-05-11 NOTE — Discharge Instructions (Addendum)
Begin Valtrex every 8 hours for the next 10 days to treat ulcers Macrobid twice daily for 5 days for UTI Diflucan for yeast May apply lidocaine topically to sores to help with pain Ibuprofen and Tylenol for pain Follow up if not improving

## 2020-05-11 NOTE — ED Triage Notes (Signed)
Pt present urgency and frequently urination. Pt states that its only a little bit of urine come out with a burning sensation. Symptoms started two days ago.

## 2020-05-11 NOTE — ED Provider Notes (Signed)
[facility]    CSN: [id number] Arrival date & time: 05/11/20  1606      History   Chief Complaint Chief Complaint  Patient presents with   Urinary Tract Infection    HPI Catherine Morales is a 29 y.o. female presenting today for evaluation of possible UTI and vaginal irritation.  Patient reports over the past 4 days she has developed increased pain, dysuria, urgency urinary frequency and incomplete voiding.  She reports history of UTIs and feels similar.  Also concerned about possible yeast.  She denies any fevers.  Denies nausea or vomiting.  HPI  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   Bacterial vaginitis 05/12/2017   Chronic low back pain 04/21/2015   Preventative health care 04/05/2015   Pyelonephritis 04/02/2015   CHLAMYDTRACHOMATIS INFECTION LOWER GU SITES 08/15/2010   UNSPECIFIED VAGINITIS AND VULVOVAGINITIS 08/08/2010    Past Surgical History:  Procedure Laterality Date   WISDOM TOOTH EXTRACTION      OB History    Gravida  2   Para  1   Term  1   Preterm      AB      Living  1     SAB      TAB      Ectopic      Multiple      Live Births           Obstetric Comments  Age 58 with first baby         Home Medications    Prior to Admission medications   Medication Sig Start Date End Date Taking? Authorizing Provider  acetaminophen (TYLENOL) 325 MG tablet Take 2 tablets (650 mg total) by mouth every 6 (six) hours as needed. 08/11/17   Derwood Kaplan, MD  fluconazole (DIFLUCAN) 150 MG tablet Take 1 tablet (150 mg total) by mouth once for 1 dose. 05/11/20 05/11/20  Spero Gunnels C, PA-C  ibuprofen (ADVIL) 800 MG tablet Take 1 tablet (800 mg total) by mouth 3 (three) times daily.  05/11/20   Roger Fasnacht C, PA-C  lidocaine (XYLOCAINE) 2 % solution Apply every 4 hours as needed for pain 05/11/20   Deyanna Mctier, [employer] C, PA-C  nitrofurantoin, macrocrystal-monohydrate, (MACROBID) 100 MG capsule Take 1 capsule (100 mg total) by mouth 2 (two) times daily for 5 days. 05/11/20 05/16/20  Zaryia Markel C, PA-C  ondansetron (ZOFRAN ODT) 4 MG disintegrating tablet Take 1 tablet (4 mg total) by mouth every 8 (eight) hours as needed for nausea or vomiting. 04/23/20   Pretty Weltman C, PA-C  valACYclovir (VALTREX) 1000 MG tablet Take 1 tablet (1,000 mg total) by mouth 3 (three) times daily for 14 days. 05/11/20 05/25/20  Chuckie Mccathern, Junius Creamer, PA-C    Family History Family History  Problem Relation Age of Onset   Cancer Maternal Grandmother    Cancer Paternal Grandmother    Healthy Mother    Healthy Father     Social History Social History   Tobacco Use   Smoking status: Current Every Day Smoker    Packs/day: 0.30    Types: Cigarettes   Smokeless tobacco: Never Used  Vaping Use   Vaping Use: Never used  Substance Use  Topics   Alcohol use: Not Currently    Alcohol/week: 0.0 standard drinks    Comment: socially    Drug use: No     Allergies   Patient has no known allergies.   Review of Systems Review of Systems  Constitutional: Negative for fever.  Respiratory: Negative for shortness of breath.   Cardiovascular: Negative for chest pain.  Gastrointestinal: Negative for abdominal pain, diarrhea, nausea and vomiting.  Genitourinary: Positive for dysuria and genital sores. Negative for flank pain, hematuria, menstrual problem, vaginal bleeding, vaginal discharge and vaginal pain.  Musculoskeletal: Negative for back pain.  Skin: Negative for rash.  Neurological: Negative for dizziness, light-headedness and headaches.     Physical Exam Triage Vital Signs ED Triage Vitals  Enc Vitals Group     BP 05/11/20 1731 133/70     Pulse Rate 05/11/20 1731 (!) 101       Resp 05/11/20 1731 18     Temp 05/11/20 1731 98.9 F (37.2 C)     Temp Source 05/11/20 1731 Oral     SpO2 05/11/20 1731 100 %     Weight --      Height --      Head Circumference --      Peak Flow --      Pain Score 05/11/20 1729 10     Pain Loc --      Pain Edu? --      Excl. in GC? --    No data found.  Updated Vital Signs BP 133/70 (BP Location: Right Arm)    Pulse (!) 101    Temp 98.9 F (37.2 C) (Oral)    Resp 18    LMP 04/17/2020 (Approximate)    SpO2 100%   Visual Acuity Right Eye Distance:   Left Eye Distance:   Bilateral Distance:    Right Eye Near:   Left Eye Near:    Bilateral Near:     Physical Exam Vitals and nursing note reviewed.  Constitutional:      Appearance: She is well-developed.     Comments: No acute distress  HENT:     Head: Normocephalic and atraumatic.     Nose: Nose normal.  Eyes:     Conjunctiva/sclera: Conjunctivae normal.  Cardiovascular:     Rate and Rhythm: Normal rate.  Pulmonary:     Effort: Pulmonary effort is normal. No respiratory distress.  Abdominal:     General: There is no distension.  Genitourinary:    Comments: External genital area with multiple ulcerative appearing lesions to labia, labia minora clitoral hood, and near vaginal introitus  Patient not tolerating speculum exam Musculoskeletal:        General: Normal range of motion.     Cervical back: Neck supple.  Skin:    General: Skin is warm and dry.  Neurological:     Mental Status: She is alert and oriented to person, place, and time.      UC Treatments / Results  Labs (all labs ordered are listed, but only abnormal results are displayed) Labs Reviewed  POCT URINALYSIS DIPSTICK, ED / UC - Abnormal; Notable for the following components:      Result Value   Hgb urine dipstick MODERATE (*)    pH 8.5 (*)    Protein, ur >=300 (*)    Leukocytes,Ua SMALL (*)    All other components within normal limits  HSV CULTURE AND TYPING  POC URINE PREG, ED   CERVICOVAGINAL ANCILLARY ONLY  EKG   Radiology No results found.  Procedures Procedures (including critical care time)  Medications Ordered in UC Medications - No data to display  Initial Impression / Assessment and Plan / UC Course  I have reviewed the triage vital signs and the nursing notes.  Pertinent labs & imaging results that were available during my care of the patient were reviewed by me and considered in my medical decision making (see chart for details).     1.  HSV-sores highly suggestive of HSV, viral swab pending, initiating on Valtrex, suspect likely large contributor of discomfort.  Lidocaine topically to help with discomfort.  Discussed following up with OB/GYN  2.  Dysuria-suspect mainly from HSV sores, but given patient reported history of prior UTIs, was able to obtain enough urine to run UA which did have small leuks and hemoglobin, will treat with Macrobid x5 days, not enough urine to obtain pregnancy test or culture.  Vaginal swab pending to screen for other vaginal infections, empirically treating for yeast  Discussed strict return precautions. Patient verbalized understanding and is agreeable with plan.  Final Clinical Impressions(s) / UC Diagnoses   Final diagnoses:  HSV (herpes simplex virus) infection  Dysuria     Discharge Instructions     Begin Valtrex every 8 hours for the next 10 days to treat ulcers Macrobid twice daily for 5 days for UTI Diflucan for yeast May apply lidocaine topically to sores to help with pain Ibuprofen and Tylenol for pain Follow up if not improving     ED Prescriptions    Medication Sig Dispense Auth. Provider   valACYclovir (VALTREX) 1000 MG tablet Take 1 tablet (1,000 mg total) by mouth 3 (three) times daily for 14 days. 42 tablet Gleb Mcguire C, PA-C   lidocaine (XYLOCAINE) 2 % solution Apply every 4 hours as needed for pain 100 mL Jaylen Knope C, PA-C   fluconazole (DIFLUCAN) 150 MG tablet Take  1 tablet (150 mg total) by mouth once for 1 dose. 2 tablet Emma Schupp C, PA-C   nitrofurantoin, macrocrystal-monohydrate, (MACROBID) 100 MG capsule Take 1 capsule (100 mg total) by mouth 2 (two) times daily for 5 days. 10 capsule Brailynn Breth C, PA-C   ibuprofen (ADVIL) 800 MG tablet Take 1 tablet (800 mg total) by mouth 3 (three) times daily. 21 tablet Roneshia Drew, [place] C, PA-C     PDMP not reviewed this encounter.   Lew Dawes, [place] 05/11/20 1846

## 2020-05-14 LAB — HSV CULTURE AND TYPING

## 2020-05-15 LAB — CERVICOVAGINAL ANCILLARY ONLY
Bacterial Vaginitis (gardnerella): NEGATIVE
Candida Glabrata: NEGATIVE
Candida Vaginitis: NEGATIVE
Chlamydia: NEGATIVE
Comment: NEGATIVE
Comment: NEGATIVE
Comment: NEGATIVE
Comment: NEGATIVE
Comment: NEGATIVE
Comment: NORMAL
Neisseria Gonorrhea: NEGATIVE
Trichomonas: NEGATIVE

## 2020-05-22 ENCOUNTER — Ambulatory Visit: Payer: Medicaid Other | Admitting: Obstetrics and Gynecology

## 2020-05-29 ENCOUNTER — Ambulatory Visit: Payer: Medicaid Other | Admitting: Obstetrics and Gynecology

## 2020-06-01 ENCOUNTER — Encounter: Payer: Self-pay | Admitting: Obstetrics & Gynecology

## 2020-06-01 ENCOUNTER — Other Ambulatory Visit (HOSPITAL_COMMUNITY)
Admission: RE | Admit: 2020-06-01 | Discharge: 2020-06-01 | Disposition: A | Discharge status: Home / Self Care | Payer: Medicaid Other | Source: Ambulatory Visit | Attending: Obstetrics & Gynecology | Admitting: Obstetrics & Gynecology

## 2020-06-01 ENCOUNTER — Ambulatory Visit (INDEPENDENT_AMBULATORY_CARE_PROVIDER_SITE_OTHER): Payer: Medicaid Other | Admitting: Obstetrics & Gynecology

## 2020-06-01 ENCOUNTER — Other Ambulatory Visit: Payer: Self-pay

## 2020-06-01 VITALS — BP 133/84 | HR 77 | Wt 217.0 lb

## 2020-06-01 DIAGNOSIS — R87610 Atypical squamous cells of undetermined significance on cytologic smear of cervix (ASC-US): Secondary | ICD-10-CM | POA: Insufficient documentation

## 2020-06-01 DIAGNOSIS — Z124 Encounter for screening for malignant neoplasm of cervix: Secondary | ICD-10-CM | POA: Insufficient documentation

## 2020-06-01 DIAGNOSIS — Z113 Encounter for screening for infections with a predominantly sexual mode of transmission: Secondary | ICD-10-CM

## 2020-06-01 DIAGNOSIS — R87810 Cervical high risk human papillomavirus (HPV) DNA test positive: Secondary | ICD-10-CM | POA: Diagnosis not present

## 2020-06-01 DIAGNOSIS — A6000 Herpesviral infection of urogenital system, unspecified: Secondary | ICD-10-CM | POA: Diagnosis not present

## 2020-06-01 MED ORDER — VALACYCLOVIR HCL 500 MG PO TABS: 500.0000 mg | ORAL_TABLET | Freq: Every day | ORAL | 12 refills | Status: DC

## 2020-06-01 NOTE — Progress Notes (Signed)
GYNECOLOGY OFFICE VISIT NOTE  History:   Catherine Morales is a 29 y.o. G2P1001 here today for follow up after recent diagnosis of genital HSV. Had positive culture on 05/11/2020. History of trichomonal vaginitis, chlamydia and HPV. She has completed her Valtrex regimen, wants suppression therapy and medication as needed for outbreaks. She denies any abnormal vaginal discharge, bleeding, pelvic pain or other concerns.    Past Medical History:  Diagnosis Date  . Abnormal uterine bleeding 01/29/2018  . BV (bacterial vaginosis)   . Chlamydia   . Chronic low back pain 04/21/2015  . 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 HPV in female   . Pyelonephritis 04/02/2015  . Scoliosis   . Trichomonal vaginitis     Past Surgical History:  Procedure Laterality Date  . WISDOM TOOTH EXTRACTION      The following portions of the patient's history were reviewed and updated as appropriate: allergies, current medications, past family history, past medical history, past social history, past surgical history and problem list.   Health Maintenance:  ASCUS pap and positive HRHPV on 01/29/18.   Review of Systems:  Pertinent items noted in HPI and remainder of comprehensive ROS otherwise negative.  Physical Exam:  BP 133/84   Pulse 77   Wt 217 lb (98.4 kg)   LMP 05/11/2020   Breastfeeding No   BMI 36.11 kg/m  CONSTITUTIONAL: Well-developed, well-nourished female in no acute distress.  HEENT:  Normocephalic, atraumatic. External right and left ear normal. No scleral icterus.  NECK: Normal range of motion, supple, no masses noted on observation SKIN: No rash noted. Not diaphoretic. No erythema. No pallor. MUSCULOSKELETAL: Normal range of motion. No edema noted. NEUROLOGIC: Alert and oriented to person, place, and time. Normal muscle tone coordination. No cranial nerve deficit noted. PSYCHIATRIC: Normal mood and affect. Normal behavior. Normal judgment and thought  content. CARDIOVASCULAR: Normal heart rate noted RESPIRATORY: Effort and breath sounds normal, no problems with respiration noted ABDOMEN: No masses noted. No other overt distention noted.   PELVIC: Normal appearing external genitalia with no lesions noted; normal urethral meatus; normal appearing vaginal mucosa and cervix. Pap smear obtained. White vaginal discharge noted.  Performed in the presence of a chaperone    Assessment and Plan:    1. Genital herpes simplex type 2 diagnosed on 05/11/20 Valtrex prescribed as requested. - valACYclovir (VALTREX) 500 MG tablet; Take 1 tablet (500 mg total) by mouth daily. Can increase to twice a day for 5 days in the event of a recurrence  Dispense: 90 tablet; Refill: 12  2. Screening for STD (sexually transmitted disease) Advised to practice safe sex given history of four STIs! Condoms given. Encouraged her to get partner treated and to use condoms! Serum testing done for other STIs, had recent negative GC/Chlam, Trichomonas. - HIV Antibody (routine testing w rflx) - Hepatitis C Antibody - Hepatitis B Surface AntiGEN - RPR  3. ASCUS with positive high risk HPV cervical on 01/29/18 4. Pap smear for cervical cancer screening - Cytology - PAP done today, will follow up results and manage accordingly. Routine preventative health maintenance measures emphasized. Please refer to After Visit Summary for other counseling recommendations.   Return for any gynecologic concerns.    I spent 25 minutes dedicated to the care of this patient including pre-visit review of records, face to face time with the patient discussing her conditions and treatments and post visit ordering of testing.    Jaynie Collins, MD,  [place] [facility] [employer], [place]

## 2020-06-01 NOTE — Patient Instructions (Signed)

## 2020-06-02 LAB — HIV ANTIBODY (ROUTINE TESTING W REFLEX): HIV Screen 4th Generation wRfx: NONREACTIVE

## 2020-06-02 LAB — HEPATITIS B SURFACE ANTIGEN: Hepatitis B Surface Ag: NEGATIVE

## 2020-06-02 LAB — HEPATITIS C ANTIBODY: Hep C Virus Ab: <0.1 s/co ratio (ref 0.0–0.9)

## 2020-06-02 LAB — RPR: RPR Ser Ql: NONREACTIVE

## 2020-06-06 LAB — CYTOLOGY - PAP
Comment: NEGATIVE
Diagnosis: NEGATIVE
Diagnosis: REACTIVE
High risk HPV: NEGATIVE

## 2020-06-12 ENCOUNTER — Telehealth: Payer: Self-pay | Admitting: Family Medicine

## 2020-06-12 DIAGNOSIS — N898 Other specified noninflammatory disorders of vagina: Secondary | ICD-10-CM

## 2020-06-12 MED ORDER — FLUCONAZOLE 150 MG PO TABS: 150.0000 mg | ORAL_TABLET | Freq: Once | ORAL | 0 refills | Status: AC

## 2020-06-12 NOTE — Telephone Encounter (Signed)
I called Catherine Morales and she reports she took her other medicines and she thinks it gave her a yeast infection. States she gets them all the time and wants a diflucan. I asked her what symptoms she is having so we can be sure we are treating the right thing. She reports vaginal discharge and vaginal itching. I informed her I will send in Diflucan x1. She voices understanding Sharin Altidor,RN

## 2020-06-12 NOTE — Telephone Encounter (Signed)
Patient called and left a message with Lamonte Sakai and would like for someone to call her, patient did not sate the reason for her call.

## 2020-06-27 IMAGING — US US OB TRANSVAGINAL
1 series · 15 of 27 positions shown · non-contrast
Comparison: No recent prior.

CLINICAL DATA: Pregnancy.  Questionable viability.

EXAM:
OBSTETRIC <14 WK ULTRASOUND
TECHNIQUE: Transabdominal ultrasound was performed for evaluation of the
gestation as well as the maternal uterus and adnexal regions.

[Series 1: us ob transvaginal · 27 acquisitions, 15 frames shown]
[im 1/27]
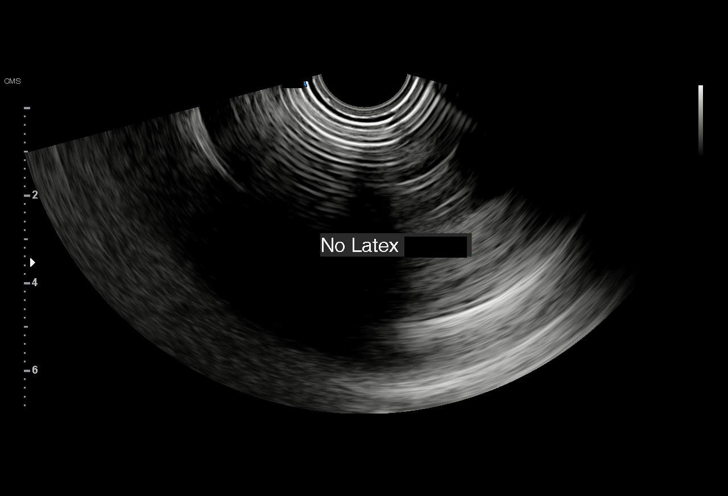
[im 3/27]
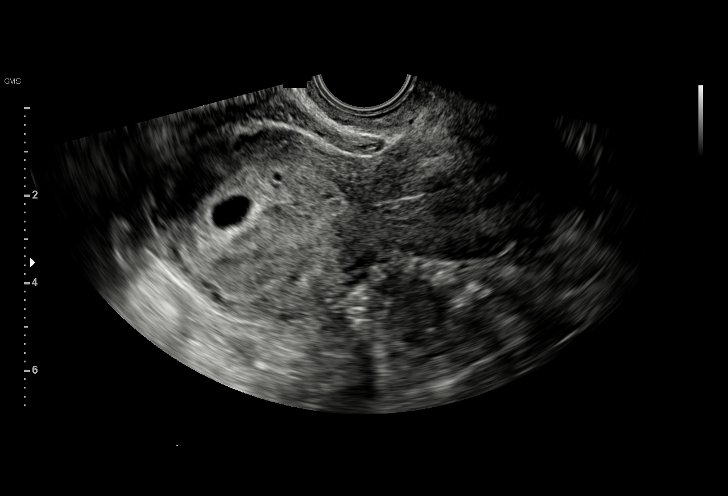
[im 5/27]
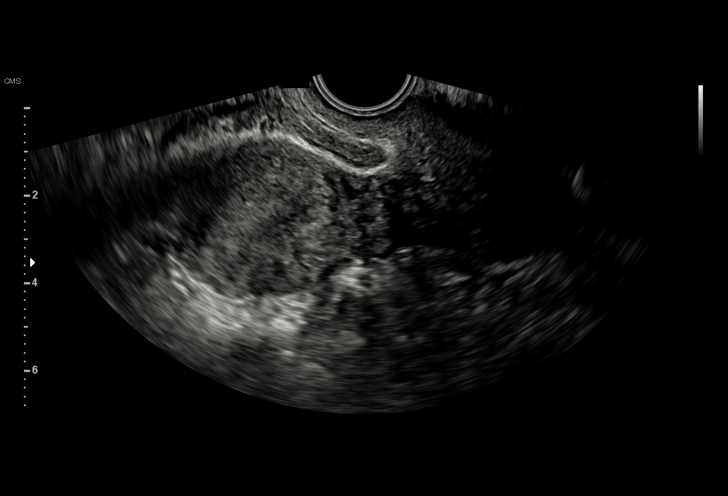
[im 7/27]
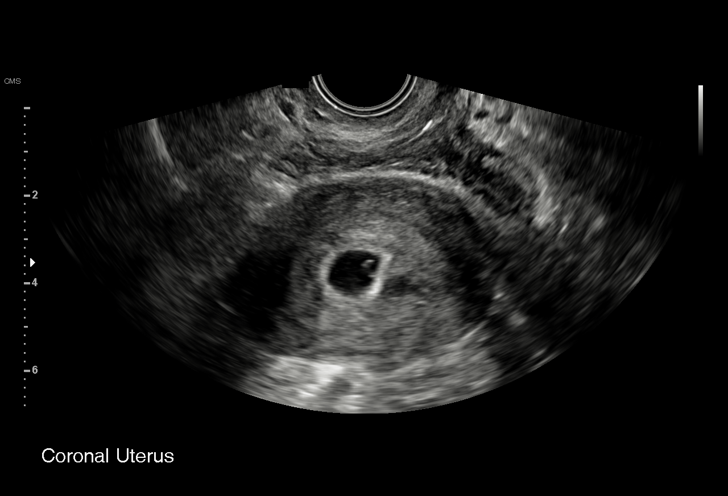
[im 9/27]
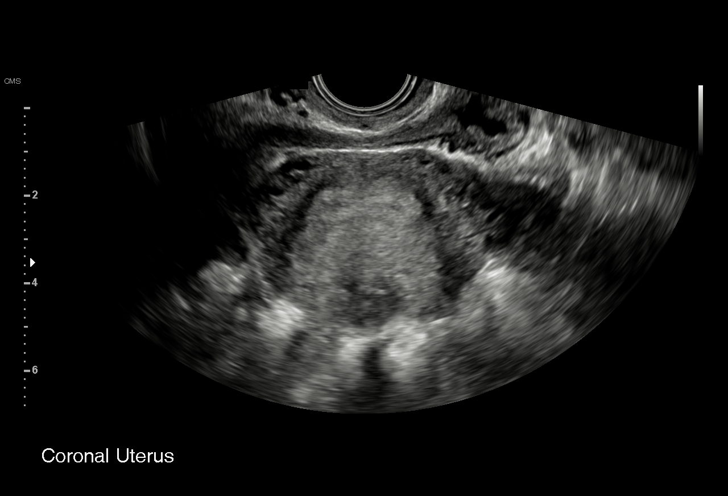
[im 10/27]
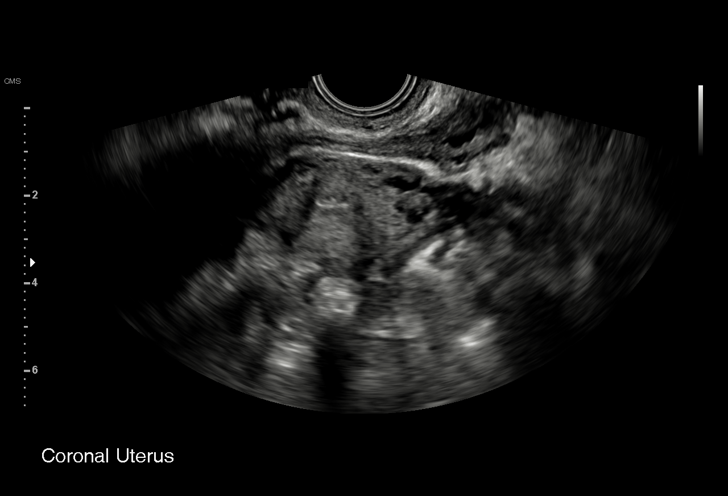
[im 12/27]
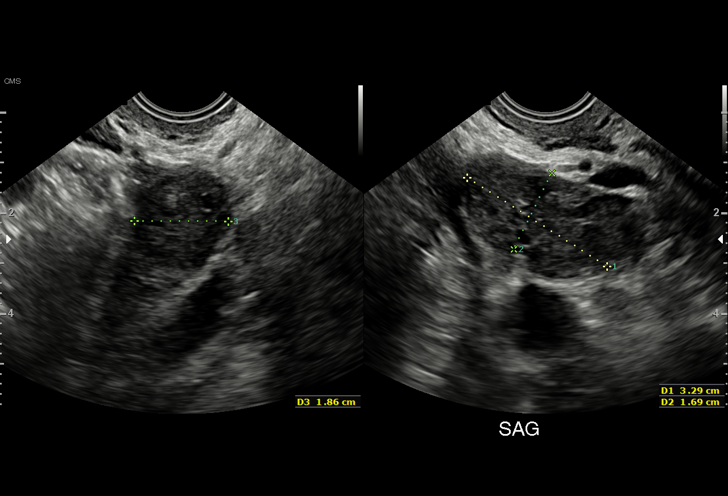
[im 14/27]
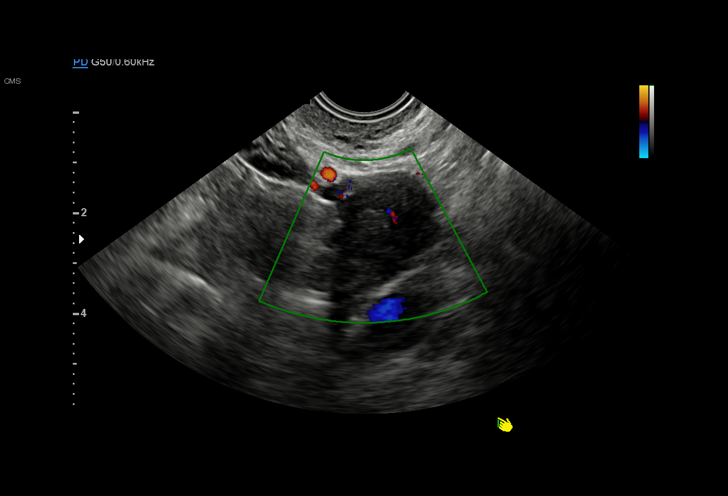
[im 16/27]
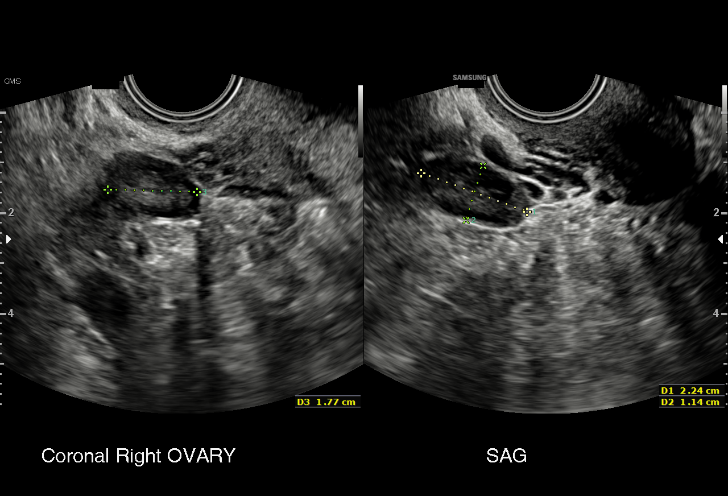
[im 18/27]
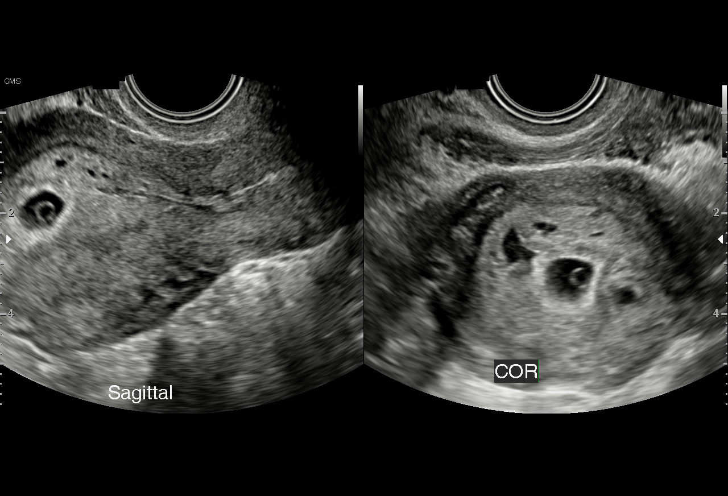
[im 19/27]
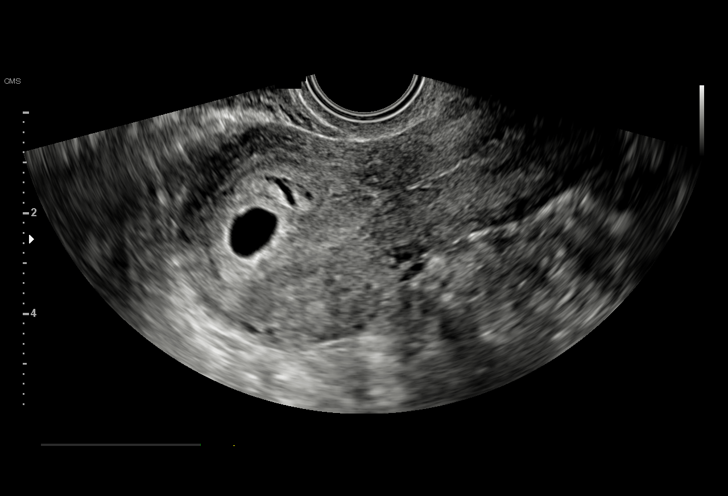
[im 21/27]
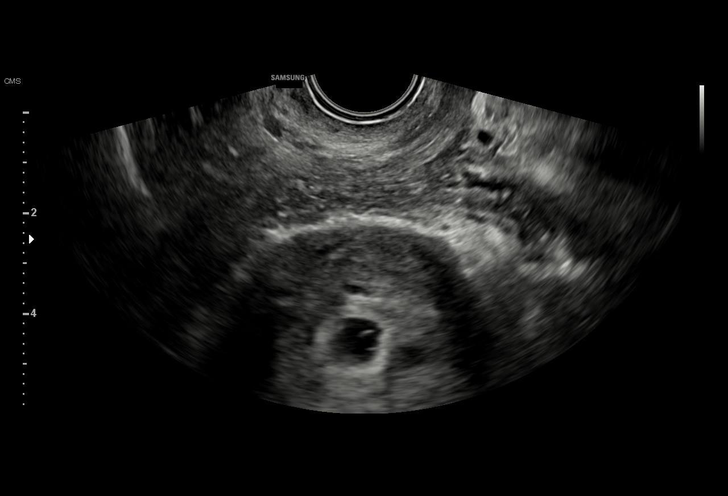
[im 23/27]
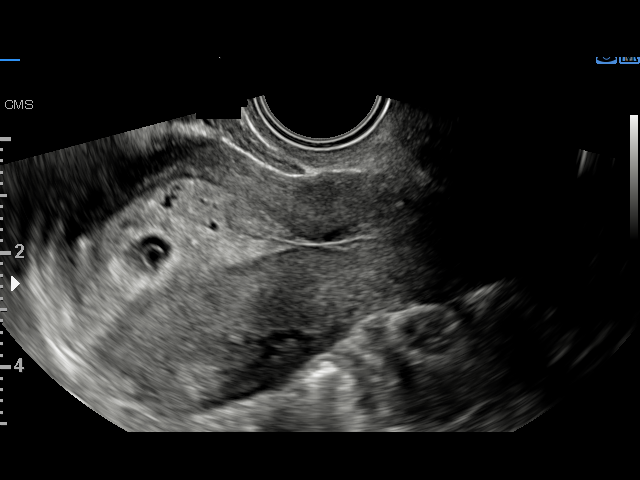
[im 25/27]
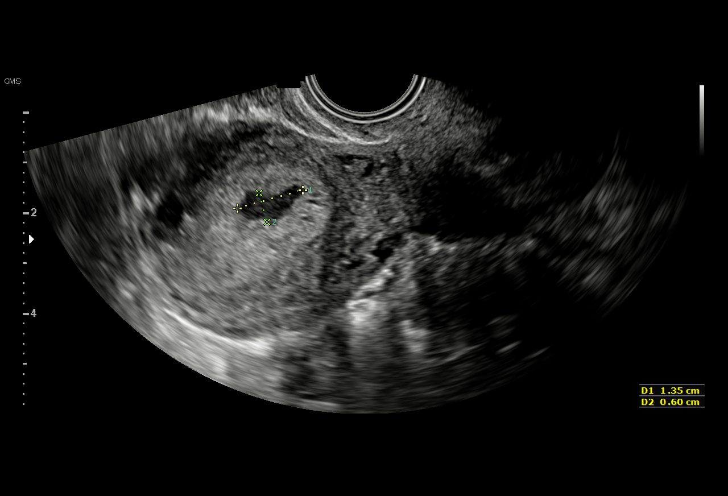
[im 27/27]
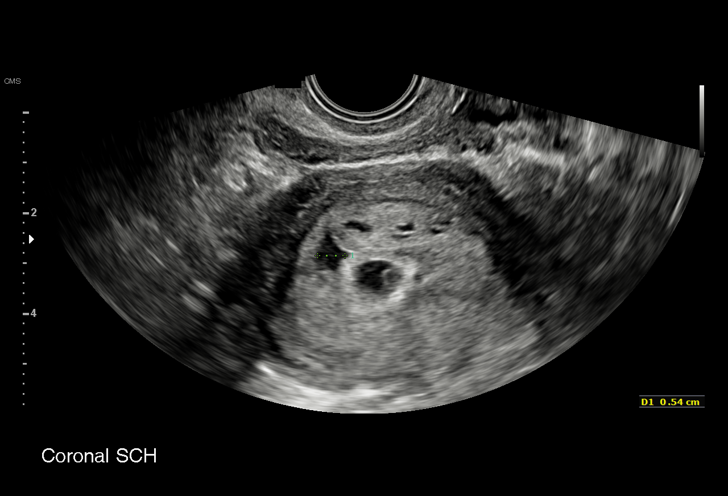

[15 of 27 positions shown; findings below may reference images not displayed]

FINDINGS: Intrauterine gestational sac: Present

Yolk sac:  Present

Embryo:  None visualized

Cardiac Activity: None visualized

MSD:  9.4 mm   5 w   5 d

Subchorionic hemorrhage:  Small

Maternal uterus/adnexae: Unremarkable.  No free pelvic fluid.
IMPRESSION: 1. Probable early intrauterine gestational sac and yolk sac, but no
fetal pole, or cardiac activity yet visualized. Recommend follow-up
quantitative B-HCG levels and follow-up US in 14 days to assess
viability. This recommendation follows SRU consensus guidelines:
Diagnostic Criteria for Nonviable Pregnancy Early in the First
Trimester. N Engl J Med 6631; [DATE].

2.  Small subchorionic hemorrhage.

## 2020-07-22 NOTE — L&D Delivery Note (Signed)
Delivery Note   Pt reached complete dilation and pushed for about 15 minutes and at 2:50 PM a healthy female was delivered via Vaginal, Spontaneous (Presentation: Right Occiput Anterior).  APGAR: 9, 9; weight pending .   Placenta status: Spontaneous;Expressed, Intact.  Cord: 3 vessels with the following complications: None.   Anesthesia: Epidural Episiotomy: None Lacerations: None Suture Repair:  N/A Est. Blood Loss (mL):  Mom to postpartum.  Baby to Couplet care / Skin to Skin. D/w pt circumcision and she desires to proceed.  Oliver Pila 05/17/2021, 3:28 PM

## 2020-09-01 ENCOUNTER — Encounter (HOSPITAL_COMMUNITY): Payer: Self-pay

## 2020-09-01 ENCOUNTER — Ambulatory Visit (HOSPITAL_COMMUNITY)
Admission: EM | Admit: 2020-09-01 | Discharge: 2020-09-01 | Disposition: A | Discharge status: Home / Self Care | Payer: Medicaid Other | Attending: Student | Admitting: Student

## 2020-09-01 ENCOUNTER — Other Ambulatory Visit: Payer: Self-pay

## 2020-09-01 DIAGNOSIS — Z3201 Encounter for pregnancy test, result positive: Secondary | ICD-10-CM | POA: Diagnosis not present

## 2020-09-01 DIAGNOSIS — N898 Other specified noninflammatory disorders of vagina: Secondary | ICD-10-CM

## 2020-09-01 DIAGNOSIS — N760 Acute vaginitis: Secondary | ICD-10-CM | POA: Diagnosis not present

## 2020-09-01 LAB — POC URINE PREG, ED: Preg Test, Ur: POSITIVE — AB

## 2020-09-01 LAB — HCG, QUANTITATIVE, PREGNANCY: hCG, Beta Chain, Quant, S: 20 m[IU]/mL — ABNORMAL HIGH (ref <5)

## 2020-09-01 LAB — POCT URINALYSIS DIPSTICK, ED / UC
Bilirubin Urine: NEGATIVE
Glucose, UA: NEGATIVE mg/dL
Hgb urine dipstick: NEGATIVE
Ketones, ur: NEGATIVE mg/dL
Leukocytes,Ua: NEGATIVE
Nitrite: NEGATIVE
Protein, ur: NEGATIVE mg/dL
Specific Gravity, Urine: 1.025 (ref 1.005–1.030)
Urobilinogen, UA: 0.2 mg/dL (ref 0.0–1.0)
pH: 7 (ref 5.0–8.0)

## 2020-09-01 NOTE — ED Triage Notes (Signed)
Pt presents with vaginal discharged and foul odor in vaginal area. Pt denies itching and discomfort. Pt states her urine smells strong and states she does not feel discomfort when urinating. Pt denies abdominal and back pain. Pt states she suspects she might be pregnant.

## 2020-09-01 NOTE — Discharge Instructions (Signed)
-  We'll call you if the result of your vaginal swab is positive for anything. We can send a medication for treatment if necessary.  -We're confirming your pregnancy today with a blood test. We'll call you if this is positive; otherwise it goes straight to email and mychart.  -Please establish care with an OB-GYN. Information below.  -head straight to the ED if you develop abdominal pain, vaginal spotting, etc.

## 2020-09-01 NOTE — ED Provider Notes (Signed)
[facility]    CSN: [id number] Arrival date & time: 09/01/20  1839      History   Chief Complaint Chief Complaint  Patient presents with  . Vaginal Discharge  . Possible Pregnancy    HPI Catherine Morales is a 30 y.o. female pt presents with vaginal discharge and foul odor in vaginal area. Pt denies itching and discomfort. Pt states her urine smells strong and states she does not feel discomfort when urinating. Pt denies abdominal and back pain. Pt states she suspects she might be pregnant. Last period was 1 month ago. Denies hematuria, dysuria, frequency, urgency, back pain, n/v/d/abd pain, fevers/chills. Denies new partners, STI risk.    HPI  Past Medical History:  Diagnosis Date  . Abnormal uterine bleeding 01/29/2018  . BV (bacterial vaginosis)   . Chlamydia   . Chronic low back pain 04/21/2015  . 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 HPV in female   . Pyelonephritis 04/02/2015  . Scoliosis   . Trichomonal vaginitis     Patient Active Problem List   Diagnosis Date Noted  . Genital herpes simplex type 2 05/11/2020  . ASCUS with positive high risk HPV cervical on 01/29/18 01/29/2018    Past Surgical History:  Procedure Laterality Date  . WISDOM TOOTH EXTRACTION      OB History    Gravida  2   Para  1   Term  1   Preterm      AB      Living  1     SAB      IAB      Ectopic      Multiple      Live Births           Obstetric Comments  Age 36 with first baby         Home Medications    Prior to Admission medications   Medication Sig Start Date End Date Taking? Authorizing Provider  valACYclovir (VALTREX) 500 MG tablet Take 1 tablet (500 mg total) by mouth daily. Can increase to twice a day for 5 days in the event of a recurrence 06/01/20   Anyanwu, Jethro Bastos, MD    Family History Family History  Problem Relation Age of Onset  . Cancer Maternal Grandmother   . Cancer Paternal Grandmother   . Healthy  Mother   . Healthy Father     Social History Social History   Tobacco Use  . Smoking status: Current Every Day Smoker    Packs/day: 0.30    Types: Cigarettes  . Smokeless tobacco: Never Used  Vaping Use  . Vaping Use: Never used  Substance Use Topics  . Alcohol use: Not Currently    Alcohol/week: 0.0 standard drinks    Comment: socially   . Drug use: No     Allergies   Patient has no known allergies.   Review of Systems Review of Systems  Constitutional: Negative for chills and fever.  HENT: Negative for sore throat.   Eyes: Negative for pain and redness.  Respiratory: Negative for shortness of breath.   Cardiovascular: Negative for chest pain.  Gastrointestinal: Negative for abdominal pain, diarrhea, nausea and vomiting.  Genitourinary: Positive for vaginal discharge. Negative for decreased urine volume, difficulty urinating, dysuria, flank pain, frequency, genital sores, hematuria, menstrual problem, pelvic pain, urgency, vaginal bleeding and vaginal pain.  Musculoskeletal: Negative for back pain.  Skin: Negative for rash.  Physical Exam Triage Vital Signs ED Triage Vitals  Enc Vitals Group     BP 09/01/20 1849 116/71     Pulse Rate 09/01/20 1846 83     Resp 09/01/20 1846 18     Temp 09/01/20 1846 98.2 F (36.8 C)     Temp Source 09/01/20 1846 Temporal     SpO2 09/01/20 1846 100 %     Weight --      Height --      Head Circumference --      Peak Flow --      Pain Score 09/01/20 1845 0     Pain Loc --      Pain Edu? --      Excl. in GC? --    No data found.  Updated Vital Signs BP 116/71   Pulse 83   Temp 98.2 F (36.8 C) (Temporal)   Resp 18   LMP 07/10/2020 (Approximate)   SpO2 100%   Visual Acuity Right Eye Distance:   Left Eye Distance:   Bilateral Distance:    Right Eye Near:   Left Eye Near:    Bilateral Near:     Physical Exam Vitals reviewed.  Constitutional:      General: She is not in acute distress.    Appearance:  Normal appearance. She is not ill-appearing.  HENT:     Head: Normocephalic and atraumatic.  Cardiovascular:     Rate and Rhythm: Normal rate and regular rhythm.     Heart sounds: Normal heart sounds.  Pulmonary:     Effort: Pulmonary effort is normal.     Breath sounds: Normal breath sounds. No wheezing, rhonchi or rales.  Abdominal:     General: Bowel sounds are normal. There is no distension.     Palpations: Abdomen is soft. There is no mass.     Tenderness: There is no abdominal tenderness. There is no right CVA tenderness, left CVA tenderness, guarding or rebound.     Comments: Bowel sounds positive in all 4 quadrants. No tenderness to palpation. Negative Murphy Sign, Rovsing's sign, McBurney point tenderness.   Neurological:     General: No focal deficit present.     Mental Status: She is alert and oriented to person, place, and time.  Psychiatric:        Mood and Affect: Mood normal.        Behavior: Behavior normal.      UC Treatments / Results  Labs (all labs ordered are listed, but only abnormal results are displayed) Labs Reviewed  POC URINE PREG, ED - Abnormal; Notable for the following components:      Result Value   Preg Test, Ur POSITIVE (*)    All other components within normal limits  HCG, QUANTITATIVE, PREGNANCY  POCT URINALYSIS DIPSTICK, ED / UC  CERVICOVAGINAL ANCILLARY ONLY    EKG   Radiology No results found.  Procedures Procedures (including critical care time)  Medications Ordered in UC Medications - No data to display  Initial Impression / Assessment and Plan / UC Course  I have reviewed the triage vital signs and the nursing notes.  Pertinent labs & imaging results that were available during my care of the patient were reviewed by me and considered in my medical decision making (see chart for details).      Urine pregnancy positive. Quantitative serum hcg sent for confirmation- positive. Last period was 1 month ago.  Establish care  with OB-GYN; information provided.   Will  send for G/C, trich, yeast, BV testing. Will send treatment based on result.   Spent over 40 minutes obtaining H&P, performing physical, discussing results, treatment plan and plan for follow-up with patient. Patient agrees with plan.   This chart was dictated using voice recognition software, Dragon. Despite the best efforts of this provider to proofread and correct errors, errors may still occur which can change documentation meaning.    Final Clinical Impressions(s) / UC Diagnoses   Final diagnoses:  Acute vaginitis  Positive pregnancy test     Discharge Instructions     -We'll call you if the result of your vaginal swab is positive for anything. We can send a medication for treatment if necessary.  -We're confirming your pregnancy today with a blood test. We'll call you if this is positive; otherwise it goes straight to email and mychart.  -Please establish care with an OB-GYN. Information below.  -head straight to the ED if you develop abdominal pain, vaginal spotting, etc.    ED Prescriptions    None     PDMP not reviewed this encounter.   Rhys Martini, PA-C 09/02/20 1018

## 2020-09-04 ENCOUNTER — Telehealth (HOSPITAL_COMMUNITY): Payer: Self-pay | Admitting: Emergency Medicine

## 2020-09-04 LAB — CERVICOVAGINAL ANCILLARY ONLY
Bacterial Vaginitis (gardnerella): POSITIVE — AB
Candida Glabrata: NEGATIVE
Candida Vaginitis: POSITIVE — AB
Chlamydia: NEGATIVE
Comment: NEGATIVE
Comment: NEGATIVE
Comment: NEGATIVE
Comment: NEGATIVE
Comment: NEGATIVE
Comment: NORMAL
Neisseria Gonorrhea: POSITIVE — AB
Trichomonas: NEGATIVE

## 2020-09-04 MED ORDER — METRONIDAZOLE 0.75 % VA GEL: 1.0000 | Freq: Every day | VAGINAL | 0 refills | Status: AC

## 2020-09-04 MED ORDER — TERCONAZOLE 0.4 % VA CREA: 1.0000 | TOPICAL_CREAM | Freq: Every day | VAGINAL | 0 refills | Status: DC

## 2020-09-05 ENCOUNTER — Other Ambulatory Visit: Payer: Self-pay

## 2020-09-05 ENCOUNTER — Ambulatory Visit (HOSPITAL_COMMUNITY)
Admission: EM | Admit: 2020-09-05 | Discharge: 2020-09-05 | Disposition: A | Discharge status: Home / Self Care | Payer: Medicaid Other | Attending: Physician Assistant | Admitting: Physician Assistant

## 2020-09-05 DIAGNOSIS — A549 Gonococcal infection, unspecified: Secondary | ICD-10-CM | POA: Diagnosis not present

## 2020-09-05 MED ORDER — CEFTRIAXONE SODIUM 500 MG IJ SOLR
INTRAMUSCULAR | Status: AC
  Filled 2020-09-05: qty 500

## 2020-09-05 MED ORDER — CEFTRIAXONE SODIUM 500 MG IJ SOLR
500.0000 mg | Freq: Once | INTRAMUSCULAR | Status: AC
  Administered 2020-09-05: 500 mg via INTRAMUSCULAR

## 2020-09-05 NOTE — ED Triage Notes (Signed)
Pt presents for IM rocephin treatment for gonorrhea.

## 2020-09-20 ENCOUNTER — Encounter: Payer: Self-pay | Admitting: Obstetrics and Gynecology

## 2020-09-20 DIAGNOSIS — Z3480 Encounter for supervision of other normal pregnancy, unspecified trimester: Secondary | ICD-10-CM | POA: Diagnosis not present

## 2020-09-20 DIAGNOSIS — Z3201 Encounter for pregnancy test, result positive: Secondary | ICD-10-CM | POA: Diagnosis not present

## 2020-09-20 DIAGNOSIS — N925 Other specified irregular menstruation: Secondary | ICD-10-CM | POA: Diagnosis not present

## 2020-10-16 DIAGNOSIS — Z124 Encounter for screening for malignant neoplasm of cervix: Secondary | ICD-10-CM | POA: Diagnosis not present

## 2020-10-16 DIAGNOSIS — Z3480 Encounter for supervision of other normal pregnancy, unspecified trimester: Secondary | ICD-10-CM | POA: Diagnosis not present

## 2020-10-16 DIAGNOSIS — A5901 Trichomonal vulvovaginitis: Secondary | ICD-10-CM

## 2020-10-16 HISTORY — DX: Trichomonal vulvovaginitis: A59.01

## 2020-10-16 LAB — OB RESULTS CONSOLE ABO/RH: RH Type: POSITIVE

## 2020-10-16 LAB — OB RESULTS CONSOLE GC/CHLAMYDIA
Chlamydia: NEGATIVE
Gonorrhea: NEGATIVE

## 2020-10-16 LAB — OB RESULTS CONSOLE HEPATITIS B SURFACE ANTIGEN: Hepatitis B Surface Ag: NEGATIVE

## 2020-10-16 LAB — OB RESULTS CONSOLE RUBELLA ANTIBODY, IGM: Rubella: IMMUNE

## 2020-10-16 LAB — OB RESULTS CONSOLE HIV ANTIBODY (ROUTINE TESTING): HIV: NONREACTIVE

## 2020-10-16 LAB — OB RESULTS CONSOLE RPR: RPR: NONREACTIVE

## 2020-10-16 LAB — OB RESULTS CONSOLE ANTIBODY SCREEN: Antibody Screen: NEGATIVE

## 2020-10-23 ENCOUNTER — Other Ambulatory Visit: Payer: Self-pay | Admitting: Obstetrics and Gynecology

## 2020-10-24 DIAGNOSIS — R7309 Other abnormal glucose: Secondary | ICD-10-CM | POA: Diagnosis not present

## 2020-11-20 DIAGNOSIS — A599 Trichomoniasis, unspecified: Secondary | ICD-10-CM | POA: Diagnosis not present

## 2020-11-20 DIAGNOSIS — Z3480 Encounter for supervision of other normal pregnancy, unspecified trimester: Secondary | ICD-10-CM | POA: Diagnosis not present

## 2020-11-20 DIAGNOSIS — Z3482 Encounter for supervision of other normal pregnancy, second trimester: Secondary | ICD-10-CM | POA: Diagnosis not present

## 2020-11-21 ENCOUNTER — Other Ambulatory Visit: Payer: Self-pay | Admitting: Obstetrics and Gynecology

## 2020-11-21 DIAGNOSIS — Z3A19 19 weeks gestation of pregnancy: Secondary | ICD-10-CM

## 2020-11-21 DIAGNOSIS — Z363 Encounter for antenatal screening for malformations: Secondary | ICD-10-CM

## 2020-11-21 DIAGNOSIS — O99212 Obesity complicating pregnancy, second trimester: Secondary | ICD-10-CM

## 2020-12-19 ENCOUNTER — Encounter: Payer: Self-pay | Admitting: *Deleted

## 2020-12-20 ENCOUNTER — Other Ambulatory Visit: Payer: Self-pay

## 2020-12-20 ENCOUNTER — Encounter: Payer: Self-pay | Admitting: *Deleted

## 2020-12-20 ENCOUNTER — Ambulatory Visit: Payer: Medicaid Other | Attending: Obstetrics and Gynecology

## 2020-12-20 ENCOUNTER — Ambulatory Visit: Payer: Medicaid Other | Admitting: *Deleted

## 2020-12-20 VITALS — BP 111/60 | HR 86

## 2020-12-20 DIAGNOSIS — Z363 Encounter for antenatal screening for malformations: Secondary | ICD-10-CM

## 2020-12-20 DIAGNOSIS — Z6834 Body mass index (BMI) 34.0-34.9, adult: Secondary | ICD-10-CM

## 2020-12-20 DIAGNOSIS — O99212 Obesity complicating pregnancy, second trimester: Secondary | ICD-10-CM

## 2020-12-20 DIAGNOSIS — Z3A19 19 weeks gestation of pregnancy: Secondary | ICD-10-CM | POA: Diagnosis not present

## 2020-12-21 ENCOUNTER — Other Ambulatory Visit: Payer: Self-pay | Admitting: *Deleted

## 2020-12-21 DIAGNOSIS — Z6834 Body mass index (BMI) 34.0-34.9, adult: Secondary | ICD-10-CM

## 2021-01-17 ENCOUNTER — Encounter: Payer: Self-pay | Admitting: *Deleted

## 2021-01-17 ENCOUNTER — Other Ambulatory Visit: Payer: Self-pay

## 2021-01-17 ENCOUNTER — Ambulatory Visit: Payer: Medicaid Other | Attending: Obstetrics and Gynecology

## 2021-01-17 ENCOUNTER — Ambulatory Visit: Payer: Medicaid Other | Admitting: *Deleted

## 2021-01-17 DIAGNOSIS — Z3A23 23 weeks gestation of pregnancy: Secondary | ICD-10-CM

## 2021-01-17 DIAGNOSIS — F17210 Nicotine dependence, cigarettes, uncomplicated: Secondary | ICD-10-CM

## 2021-01-17 DIAGNOSIS — O98512 Other viral diseases complicating pregnancy, second trimester: Secondary | ICD-10-CM | POA: Diagnosis not present

## 2021-01-17 DIAGNOSIS — Z6834 Body mass index (BMI) 34.0-34.9, adult: Secondary | ICD-10-CM | POA: Diagnosis present

## 2021-01-17 DIAGNOSIS — O99212 Obesity complicating pregnancy, second trimester: Secondary | ICD-10-CM | POA: Diagnosis not present

## 2021-01-17 DIAGNOSIS — O99332 Smoking (tobacco) complicating pregnancy, second trimester: Secondary | ICD-10-CM

## 2021-01-17 DIAGNOSIS — Z362 Encounter for other antenatal screening follow-up: Secondary | ICD-10-CM | POA: Diagnosis not present

## 2021-01-17 DIAGNOSIS — E669 Obesity, unspecified: Secondary | ICD-10-CM | POA: Diagnosis not present

## 2021-01-17 DIAGNOSIS — B009 Herpesviral infection, unspecified: Secondary | ICD-10-CM

## 2021-02-20 DIAGNOSIS — R4781 Slurred speech: Secondary | ICD-10-CM | POA: Diagnosis not present

## 2021-02-20 DIAGNOSIS — Z3480 Encounter for supervision of other normal pregnancy, unspecified trimester: Secondary | ICD-10-CM | POA: Diagnosis not present

## 2021-03-29 ENCOUNTER — Other Ambulatory Visit: Payer: Self-pay

## 2021-03-29 ENCOUNTER — Ambulatory Visit (HOSPITAL_COMMUNITY)
Admission: EM | Admit: 2021-03-29 | Discharge: 2021-03-29 | Discharge status: Against Medical Advice | Payer: Medicaid Other

## 2021-03-29 NOTE — ED Notes (Signed)
Called in lobby, no answered.  °

## 2021-03-29 NOTE — ED Notes (Signed)
No answer in waiting.  

## 2021-03-29 NOTE — ED Notes (Signed)
No answer in waiting x2.  

## 2021-04-19 DIAGNOSIS — Z3480 Encounter for supervision of other normal pregnancy, unspecified trimester: Secondary | ICD-10-CM | POA: Diagnosis not present

## 2021-04-19 DIAGNOSIS — N760 Acute vaginitis: Secondary | ICD-10-CM | POA: Diagnosis not present

## 2021-04-19 LAB — OB RESULTS CONSOLE GBS: GBS: NEGATIVE

## 2021-05-14 DIAGNOSIS — O480 Post-term pregnancy: Secondary | ICD-10-CM | POA: Diagnosis not present

## 2021-05-14 DIAGNOSIS — Z3A40 40 weeks gestation of pregnancy: Secondary | ICD-10-CM | POA: Diagnosis not present

## 2021-05-15 ENCOUNTER — Telehealth (HOSPITAL_COMMUNITY): Payer: Self-pay | Admitting: *Deleted

## 2021-05-15 NOTE — Telephone Encounter (Signed)
Preadmission screen  

## 2021-05-16 ENCOUNTER — Other Ambulatory Visit: Payer: Self-pay | Admitting: Obstetrics and Gynecology

## 2021-05-16 ENCOUNTER — Encounter (HOSPITAL_COMMUNITY): Payer: Self-pay | Admitting: Obstetrics and Gynecology

## 2021-05-17 ENCOUNTER — Inpatient Hospital Stay (HOSPITAL_COMMUNITY): Payer: Medicaid Other | Admitting: Anesthesiology

## 2021-05-17 ENCOUNTER — Encounter (HOSPITAL_COMMUNITY): Payer: Self-pay | Admitting: Anesthesiology

## 2021-05-17 ENCOUNTER — Encounter (HOSPITAL_COMMUNITY): Payer: Self-pay | Admitting: Obstetrics and Gynecology

## 2021-05-17 ENCOUNTER — Inpatient Hospital Stay (HOSPITAL_COMMUNITY)
Admission: AD | Admit: 2021-05-17 | Discharge: 2021-05-19 | DRG: 806 | Disposition: A | Discharge status: Home / Self Care | Payer: Medicaid Other | Attending: Obstetrics and Gynecology | Admitting: Obstetrics and Gynecology

## 2021-05-17 ENCOUNTER — Other Ambulatory Visit: Payer: Self-pay

## 2021-05-17 DIAGNOSIS — Z3A40 40 weeks gestation of pregnancy: Secondary | ICD-10-CM

## 2021-05-17 DIAGNOSIS — K429 Umbilical hernia without obstruction or gangrene: Secondary | ICD-10-CM | POA: Diagnosis present

## 2021-05-17 DIAGNOSIS — A6000 Herpesviral infection of urogenital system, unspecified: Secondary | ICD-10-CM | POA: Diagnosis present

## 2021-05-17 DIAGNOSIS — O99214 Obesity complicating childbirth: Secondary | ICD-10-CM | POA: Diagnosis present

## 2021-05-17 DIAGNOSIS — O99892 Other specified diseases and conditions complicating childbirth: Secondary | ICD-10-CM | POA: Diagnosis present

## 2021-05-17 DIAGNOSIS — F17210 Nicotine dependence, cigarettes, uncomplicated: Secondary | ICD-10-CM | POA: Diagnosis present

## 2021-05-17 DIAGNOSIS — O480 Post-term pregnancy: Principal | ICD-10-CM | POA: Diagnosis present

## 2021-05-17 DIAGNOSIS — Z20822 Contact with and (suspected) exposure to covid-19: Secondary | ICD-10-CM | POA: Diagnosis present

## 2021-05-17 DIAGNOSIS — O9832 Other infections with a predominantly sexual mode of transmission complicating childbirth: Secondary | ICD-10-CM | POA: Diagnosis present

## 2021-05-17 DIAGNOSIS — O99334 Smoking (tobacco) complicating childbirth: Secondary | ICD-10-CM | POA: Diagnosis present

## 2021-05-17 DIAGNOSIS — D62 Acute posthemorrhagic anemia: Secondary | ICD-10-CM | POA: Diagnosis not present

## 2021-05-17 DIAGNOSIS — O26893 Other specified pregnancy related conditions, third trimester: Secondary | ICD-10-CM | POA: Diagnosis present

## 2021-05-17 DIAGNOSIS — O9081 Anemia of the puerperium: Secondary | ICD-10-CM | POA: Diagnosis not present

## 2021-05-17 LAB — CBC
HCT: 32 % — ABNORMAL LOW (ref 36.0–46.0)
Hemoglobin: 10.1 g/dL — ABNORMAL LOW (ref 12.0–15.0)
MCH: 29.6 pg (ref 26.0–34.0)
MCHC: 31.6 g/dL (ref 30.0–36.0)
MCV: 93.8 fL (ref 80.0–100.0)
Platelets: 246 10*3/uL (ref 150–400)
RBC: 3.41 MIL/uL — ABNORMAL LOW (ref 3.87–5.11)
RDW: 13.8 % (ref 11.5–15.5)
WBC: 9.3 10*3/uL (ref 4.0–10.5)
nRBC: 0 % (ref 0.0–0.2)

## 2021-05-17 LAB — RESP PANEL BY RT-PCR (FLU A&B, COVID) ARPGX2
Influenza A by PCR: NEGATIVE
Influenza B by PCR: NEGATIVE
SARS Coronavirus 2 by RT PCR: NEGATIVE

## 2021-05-17 LAB — TYPE AND SCREEN
ABO/RH(D): B POS
Antibody Screen: NEGATIVE

## 2021-05-17 MED ORDER — LIDOCAINE HCL (PF) 1 % IJ SOLN
INTRAMUSCULAR | Status: DC | PRN
  Administered 2021-05-17: 10 mL via EPIDURAL

## 2021-05-17 MED ORDER — DIBUCAINE (PERIANAL) 1 % EX OINT: 1.0000 "application " | TOPICAL_OINTMENT | CUTANEOUS | Status: DC | PRN

## 2021-05-17 MED ORDER — ONDANSETRON HCL 4 MG PO TABS: 4.0000 mg | ORAL_TABLET | ORAL | Status: DC | PRN

## 2021-05-17 MED ORDER — TERBUTALINE SULFATE 1 MG/ML IJ SOLN: 0.2500 mg | Freq: Once | INTRAMUSCULAR | Status: DC | PRN

## 2021-05-17 MED ORDER — OXYCODONE-ACETAMINOPHEN 5-325 MG PO TABS: 2.0000 | ORAL_TABLET | ORAL | Status: DC | PRN

## 2021-05-17 MED ORDER — DIPHENHYDRAMINE HCL 25 MG PO CAPS: 25.0000 mg | ORAL_CAPSULE | Freq: Four times a day (QID) | ORAL | Status: DC | PRN

## 2021-05-17 MED ORDER — OXYTOCIN-SODIUM CHLORIDE 30-0.9 UT/500ML-% IV SOLN
2.5000 [IU]/h | INTRAVENOUS | Status: DC
  Administered 2021-05-17: 2.5 [IU]/h via INTRAVENOUS
  Filled 2021-05-17: qty 500

## 2021-05-17 MED ORDER — COCONUT OIL OIL: 1.0000 "application " | TOPICAL_OIL | Status: DC | PRN

## 2021-05-17 MED ORDER — IBUPROFEN 600 MG PO TABS
600.0000 mg | ORAL_TABLET | Freq: Four times a day (QID) | ORAL | Status: DC
  Administered 2021-05-17 – 2021-05-19 (×6): 600 mg via ORAL
  Filled 2021-05-17 (×8): qty 1

## 2021-05-17 MED ORDER — FLEET ENEMA 7-19 GM/118ML RE ENEM: 1.0000 | ENEMA | RECTAL | Status: DC | PRN

## 2021-05-17 MED ORDER — DIPHENHYDRAMINE HCL 50 MG/ML IJ SOLN: 12.5000 mg | INTRAMUSCULAR | Status: DC | PRN

## 2021-05-17 MED ORDER — SOD CITRATE-CITRIC ACID 500-334 MG/5ML PO SOLN: 30.0000 mL | ORAL | Status: DC | PRN

## 2021-05-17 MED ORDER — PANTOPRAZOLE SODIUM 40 MG PO TBEC
40.0000 mg | DELAYED_RELEASE_TABLET | Freq: Every day | ORAL | Status: DC
  Administered 2021-05-17 – 2021-05-19 (×3): 40 mg via ORAL
  Filled 2021-05-17 (×3): qty 1

## 2021-05-17 MED ORDER — VALACYCLOVIR HCL 500 MG PO TABS
500.0000 mg | ORAL_TABLET | Freq: Every day | ORAL | Status: DC
  Administered 2021-05-18 – 2021-05-19 (×2): 500 mg via ORAL
  Filled 2021-05-17 (×2): qty 1

## 2021-05-17 MED ORDER — ACETAMINOPHEN 325 MG PO TABS: 650.0000 mg | ORAL_TABLET | ORAL | Status: DC | PRN

## 2021-05-17 MED ORDER — PHENYLEPHRINE 40 MCG/ML (10ML) SYRINGE FOR IV PUSH (FOR BLOOD PRESSURE SUPPORT): 80.0000 ug | PREFILLED_SYRINGE | INTRAVENOUS | Status: DC | PRN

## 2021-05-17 MED ORDER — ONDANSETRON HCL 4 MG/2ML IJ SOLN: 4.0000 mg | INTRAMUSCULAR | Status: DC | PRN

## 2021-05-17 MED ORDER — OXYTOCIN BOLUS FROM INFUSION
333.0000 mL | Freq: Once | INTRAVENOUS | Status: AC
  Administered 2021-05-17: 333 mL via INTRAVENOUS

## 2021-05-17 MED ORDER — BUTORPHANOL TARTRATE 1 MG/ML IJ SOLN: 1.0000 mg | Freq: Once | INTRAMUSCULAR | Status: AC

## 2021-05-17 MED ORDER — FENTANYL CITRATE (PF) 100 MCG/2ML IJ SOLN
100.0000 ug | Freq: Once | INTRAMUSCULAR | Status: AC
Start: 2021-05-17 — End: 2021-05-17

## 2021-05-17 MED ORDER — ZOLPIDEM TARTRATE 5 MG PO TABS: 5.0000 mg | ORAL_TABLET | Freq: Every evening | ORAL | Status: DC | PRN

## 2021-05-17 MED ORDER — FENTANYL-BUPIVACAINE-NACL 0.5-0.125-0.9 MG/250ML-% EP SOLN
12.0000 mL/h | EPIDURAL | Status: DC | PRN
  Administered 2021-05-17: 12 mL/h via EPIDURAL
  Filled 2021-05-17: qty 250

## 2021-05-17 MED ORDER — MISOPROSTOL 25 MCG QUARTER TABLET: 25.0000 ug | ORAL_TABLET | ORAL | Status: DC | PRN

## 2021-05-17 MED ORDER — ONDANSETRON HCL 4 MG/2ML IJ SOLN: 4.0000 mg | Freq: Four times a day (QID) | INTRAMUSCULAR | Status: DC | PRN

## 2021-05-17 MED ORDER — ACETAMINOPHEN 325 MG PO TABS
650.0000 mg | ORAL_TABLET | ORAL | Status: DC | PRN
  Administered 2021-05-18 (×2): 650 mg via ORAL
  Filled 2021-05-17 (×3): qty 2

## 2021-05-17 MED ORDER — SENNOSIDES-DOCUSATE SODIUM 8.6-50 MG PO TABS
2.0000 | ORAL_TABLET | ORAL | Status: DC
  Administered 2021-05-18 – 2021-05-19 (×2): 2 via ORAL
  Filled 2021-05-17 (×2): qty 2

## 2021-05-17 MED ORDER — OXYCODONE-ACETAMINOPHEN 5-325 MG PO TABS: 1.0000 | ORAL_TABLET | ORAL | Status: DC | PRN

## 2021-05-17 MED ORDER — LACTATED RINGERS IV SOLN: INTRAVENOUS | Status: DC

## 2021-05-17 MED ORDER — EPHEDRINE 5 MG/ML INJ: 10.0000 mg | INTRAVENOUS | Status: DC | PRN

## 2021-05-17 MED ORDER — BUTORPHANOL TARTRATE 1 MG/ML IJ SOLN
INTRAMUSCULAR | Status: AC
  Administered 2021-05-17: 1 mg via INTRAVENOUS
  Filled 2021-05-17: qty 1

## 2021-05-17 MED ORDER — LIDOCAINE HCL (PF) 1 % IJ SOLN: 30.0000 mL | INTRAMUSCULAR | Status: DC | PRN

## 2021-05-17 MED ORDER — WITCH HAZEL-GLYCERIN EX PADS: 1.0000 "application " | MEDICATED_PAD | CUTANEOUS | Status: DC | PRN

## 2021-05-17 MED ORDER — PRENATAL MULTIVITAMIN CH
1.0000 | ORAL_TABLET | Freq: Every day | ORAL | Status: DC
  Administered 2021-05-18 – 2021-05-19 (×2): 1 via ORAL
  Filled 2021-05-17 (×2): qty 1

## 2021-05-17 MED ORDER — SIMETHICONE 80 MG PO CHEW
80.0000 mg | CHEWABLE_TABLET | ORAL | Status: DC | PRN
  Administered 2021-05-18: 80 mg via ORAL
  Filled 2021-05-17: qty 1

## 2021-05-17 MED ORDER — LACTATED RINGERS IV SOLN: 500.0000 mL | INTRAVENOUS | Status: DC | PRN

## 2021-05-17 MED ORDER — TETANUS-DIPHTH-ACELL PERTUSSIS 5-2.5-18.5 LF-MCG/0.5 IM SUSY: 0.5000 mL | PREFILLED_SYRINGE | Freq: Once | INTRAMUSCULAR | Status: DC

## 2021-05-17 MED ORDER — LACTATED RINGERS IV SOLN
500.0000 mL | Freq: Once | INTRAVENOUS | Status: AC
  Administered 2021-05-17: 500 mL via INTRAVENOUS

## 2021-05-17 MED ORDER — BENZOCAINE-MENTHOL 20-0.5 % EX AERO: 1.0000 "application " | INHALATION_SPRAY | CUTANEOUS | Status: DC | PRN

## 2021-05-17 MED ORDER — FENTANYL CITRATE (PF) 100 MCG/2ML IJ SOLN
INTRAMUSCULAR | Status: AC
  Administered 2021-05-17: 100 ug via EPIDURAL
  Filled 2021-05-17: qty 2

## 2021-05-17 NOTE — Anesthesia Preprocedure Evaluation (Signed)
Anesthesia Evaluation  Patient identified by MRN, date of birth, ID band Patient awake    Reviewed: Allergy & Precautions, H&P , NPO status , Patient's Chart, lab work & pertinent test results, reviewed documented beta blocker date and time   Airway Mallampati: II  TM Distance: >3 FB Neck ROM: full    Dental no notable dental hx.    Pulmonary neg pulmonary ROS, Current Smoker,    Pulmonary exam normal breath sounds clear to auscultation       Cardiovascular negative cardio ROS Normal cardiovascular exam Rhythm:regular Rate:Normal     Neuro/Psych negative neurological ROS  negative psych ROS   GI/Hepatic negative GI ROS, Neg liver ROS,   Endo/Other  negative endocrine ROS  Renal/GU negative Renal ROS  negative genitourinary   Musculoskeletal   Abdominal   Peds  Hematology negative hematology ROS (+)   Anesthesia Other Findings   Reproductive/Obstetrics (+) Pregnancy                             Anesthesia Physical Anesthesia Plan  ASA: 3  Anesthesia Plan: Epidural   Post-op Pain Management:    Induction:   PONV Risk Score and Plan: 2 and Treatment may vary due to age or medical condition  Airway Management Planned: Natural Airway  Additional Equipment: None  Intra-op Plan:   Post-operative Plan:   Informed Consent: I have reviewed the patients History and Physical, chart, labs and discussed the procedure including the risks, benefits and alternatives for the proposed anesthesia with the patient or authorized representative who has indicated his/her understanding and acceptance.     Dental Advisory Given  Plan Discussed with: Anesthesiologist  Anesthesia Plan Comments: (Labs checked- platelets confirmed with RN in room. Fetal heart tracing, per RN, reported to be stable enough for sitting procedure. Discussed epidural, and patient consents to the procedure:  included risk  of possible headache,backache, failed block, allergic reaction, and nerve injury. This patient was asked if she had any questions or concerns before the procedure started.)        Anesthesia Quick Evaluation

## 2021-05-17 NOTE — H&P (Signed)
Catherine Morales is a 30 y.o. female G3P1011 at 53 3/7 weeks (EDD 05/14/21 by 6 week [place]) presenting for painful contractions and cervical change to 4+cm.  Prenatal care significant for: 1) Genital herpes simplex   On valtrex suppression  2) Infection by Trichomonas  10/16/2020  Treated and negative 04/19/21  3) Body mass index 30+ - obesity   Baby ASA daily  4) Smoker--cigarettes   5) Umbilical hernia  palpable and intermittently uncomfortable, will need gen surg referral postpartum    Past OB HX 12-05-2008 M, 6lbs 4oz, Vaginal Delivery  Past Medical History:  Diagnosis Date   Abnormal uterine bleeding 01/29/2018   BV (bacterial vaginosis)    Chlamydia    Chronic low back pain 04/21/2015   Genital herpes simplex type 2 05/11/2020   Culture positive   HPV in female    Pyelonephritis 04/02/2015   Scoliosis    Trichomonal vaginitis 10/16/2020   Umbilical hernia    Past Surgical History:  Procedure Laterality Date   WISDOM TOOTH EXTRACTION     Family History: family history includes Cancer in her maternal grandmother and paternal grandmother; Healthy in her father and mother. Social History:  reports that she has been smoking cigarettes. She has been smoking an average of .3 packs per day. She has never used smokeless tobacco. She reports that she does not currently use alcohol. She reports that she does not use drugs.     Maternal Diabetes: No Genetic Screening: Normal Maternal Ultrasounds/Referrals: Normal Fetal Ultrasounds or other Referrals:  Referred to Materal Fetal Medicine  to complete anatomy US  Maternal Substance Abuse:  Yes:  Type: Smoker Significant Maternal Medications:  Meds include: Other:  Significant Maternal Lab Results:  Group B Strep negative Other Comments:  None  Review of Systems  Constitutional:  Negative for fever.  Gastrointestinal:  Positive for abdominal pain.  Genitourinary:  Negative for vaginal bleeding.  Musculoskeletal:  Negative  for back pain.  Maternal Medical History:  Reason for admission: Contractions.   Contractions: Onset was 6-12 hours ago.   Frequency: regular.   Perceived severity is moderate.   Fetal activity: Perceived fetal activity is normal.   Prenatal complications: Smoker (cigarettes), h/o HSV, Obesity Prenatal Complications - Diabetes: none.  Dilation: 7.5 Effacement (%): 90 Station: 0 Exam by:: Viona Gilmore RN AROM clear  Blood pressure 140/75, pulse 75, temperature 98.3 F (36.8 C), temperature source Oral, resp. rate 18, height 5\' 6"  (1.676 m), weight 104.3 kg, last menstrual period 07/29/2020, SpO2 99 %. Maternal Exam:  Uterine Assessment: Contraction strength is moderate.  Contraction frequency is regular.  Abdomen: Patient reports no abdominal tenderness. Fetal presentation: vertex Introitus: Normal vulva. Normal vagina.  Pelvis: adequate for delivery.    Physical Exam Constitutional:      Appearance: Normal appearance.  Cardiovascular:     Rate and Rhythm: Normal rate and regular rhythm.  Pulmonary:     Effort: Pulmonary effort is normal.  Abdominal:     Palpations: Abdomen is soft.  Genitourinary:    General: Normal vulva.  Psychiatric:        Mood and Affect: Mood normal.    Prenatal labs: ABO, Rh: --/--/B POS (10/27 1128) Antibody: NEG (10/27 1128) Rubella: Immune (03/28 0000) RPR: Nonreactive (03/28 0000)  HBsAg: Negative (03/28 0000)  HIV: Non-reactive (03/28 0000)  GBS: Negative/-- (09/29 0000)  NIPT low risk One hour GCT 103  Assessment/Plan: Pt admitted in active labor and received epidural.  Pain overall improved. AROM clear.  Expect vaginal delivery in next 1-2 hours.    Oliver Pila 05/17/2021, 1:53 PM

## 2021-05-17 NOTE — Anesthesia Procedure Notes (Signed)
Epidural Patient location during procedure: OB Start time: 05/17/2021 12:55 PM End time: 05/17/2021 1:00 PM  Staffing Anesthesiologist: Bethena Midget, MD  Preanesthetic Checklist Completed: patient identified, IV checked, site marked, risks and benefits discussed, surgical consent, monitors and equipment checked, pre-op evaluation and timeout performed  Epidural Patient position: sitting Prep: DuraPrep and site prepped and draped Patient monitoring: continuous pulse ox and blood pressure Approach: midline Location: L4-L5 Injection technique: LOR air  Needle:  Needle type: Tuohy  Needle gauge: 17 G Needle length: 9 cm and 9 Needle insertion depth: 9 cm Catheter type: closed end flexible Catheter size: 19 Gauge Catheter at skin depth: 15 cm Test dose: negative  Assessment Events: blood not aspirated, injection not painful, no injection resistance, no paresthesia and negative IV test

## 2021-05-17 NOTE — MAU Note (Signed)
Presents with c/o ctxs that began this morning @ 0430.  Denies VB or LOF.  Endorses +FM.

## 2021-05-17 NOTE — Lactation Note (Signed)
This note was copied from a baby's chart. Lactation Consultation Note  Patient Name: Catherine Morales [id number] Date: 05/17/2021 Reason for consult: L&D Initial assessment Age:28 hours Mom's feeding choice is breast and formula feeding, this is mom's first time breastfeeding. LC entered the room, mom wanted assistance with latch, mom latched infant on her left breast using the football hold position Infant latched with depth and was still breastfeeding after 15 minutes when LC left the room.  Mom understands to breastfeed infant according feeding cues, 8 to 12+ or more times within 24 hours , skin to skin. Mom knows to ask RN/LC for latch assistance if needed on MBU. Mom would like to have hand pump for home prn use.  Maternal Data Has patient been taught Hand Expression?: Yes Does the patient have breastfeeding experience prior to this delivery?: No  Feeding Mother's Current Feeding Choice: Breast Milk and Formula  LATCH Score Latch: Grasps breast easily, tongue down, lips flanged, rhythmical sucking.  Audible Swallowing: A few with stimulation  Type of Nipple: Everted at rest and after stimulation  Comfort (Breast/Nipple): Soft / non-tender  Hold (Positioning): Assistance needed to correctly position infant at breast and maintain latch.  LATCH Score: 8   Lactation Tools Discussed/Used    Interventions    Discharge Pump:  (Mom would like hand pump before leaving for prn use.) WIC Program: Yes  Consult Status Consult Status: Follow-up from L&D Date: 05/18/21 Follow-up type: In-patient    Danelle Earthly 05/17/2021, 4:37 PM

## 2021-05-17 NOTE — Anesthesia Postprocedure Evaluation (Signed)
Anesthesia Post Note  Patient: Catherine Morales  Procedure(s) Performed: AN AD HOC LABOR EPIDURAL     Patient location during evaluation: Mother Baby Anesthesia Type: Epidural Level of consciousness: awake and alert Pain management: pain level controlled Vital Signs Assessment: post-procedure vital signs reviewed and stable Respiratory status: spontaneous breathing, nonlabored ventilation and respiratory function stable Cardiovascular status: stable Postop Assessment: no headache, no backache and epidural receding Anesthetic complications: no   No notable events documented.  Last Vitals:  Vitals:   05/17/21 1620 05/17/21 1651  BP: 131/85 115/65  Pulse: 78 77  Resp: 18 18  Temp: 36.6 C 36.8 C  SpO2:  100%    Last Pain:  Vitals:   05/17/21 1651  TempSrc: Oral  PainSc:    Pain Goal: Patients Stated Pain Goal: 9 (05/17/21 1241)              Epidural/Spinal Function Cutaneous sensation: Normal sensation (05/17/21 1630)  Reesha Debes

## 2021-05-18 LAB — CBC
HCT: 27.7 % — ABNORMAL LOW (ref 36.0–46.0)
Hemoglobin: 9.1 g/dL — ABNORMAL LOW (ref 12.0–15.0)
MCH: 30.7 pg (ref 26.0–34.0)
MCHC: 32.9 g/dL (ref 30.0–36.0)
MCV: 93.6 fL (ref 80.0–100.0)
Platelets: 216 10*3/uL (ref 150–400)
RBC: 2.96 MIL/uL — ABNORMAL LOW (ref 3.87–5.11)
RDW: 13.9 % (ref 11.5–15.5)
WBC: 13.7 10*3/uL — ABNORMAL HIGH (ref 4.0–10.5)
nRBC: 0 % (ref 0.0–0.2)

## 2021-05-18 LAB — RPR: RPR Ser Ql: NONREACTIVE

## 2021-05-18 MED ORDER — FAMOTIDINE 20 MG PO TABS
20.0000 mg | ORAL_TABLET | Freq: Two times a day (BID) | ORAL | Status: DC
  Administered 2021-05-18 – 2021-05-19 (×3): 20 mg via ORAL
  Filled 2021-05-18 (×3): qty 1

## 2021-05-18 NOTE — Progress Notes (Signed)
Vitals:   05/17/21 2333 05/18/21 0317 05/18/21 0900 05/18/21 1833  BP:  115/76 132/81 123/77  Pulse: 77 81 76 74  Resp: 18 20 16 17   Temp:  98.6 F (37 C) 98.4 F (36.9 C) 98.1 F (36.7 C)  TempSrc:  Oral Oral Oral  SpO2:  100% 98% 100%  Weight:      Height:

## 2021-05-18 NOTE — Progress Notes (Addendum)
Vitals:   05/17/21 1750 05/17/21 2222 05/17/21 2333 05/18/21 0317  BP: 137/69 (!) 150/85 134/71 115/76  Pulse: 78 71 77 81  Resp: 18 18 18 20   Temp: 98 F (36.7 C) 97.7 F (36.5 C)  98.6 F (37 C)  TempSrc: Oral Oral  Oral  SpO2: 100%   100%  Weight:      Height:         Pt would like  pepcid for heartburn- ordered.

## 2021-05-18 NOTE — Progress Notes (Addendum)
Patient is eating, ambulating, voiding.  Pain control is good.  Vitals:   05/17/21 1750 05/17/21 2222 05/17/21 2333 05/18/21 0317  BP: 137/69 (!) 150/85 134/71 115/76  Pulse: 78 71 77 81  Resp: 18 18 18 20   Temp: 98 F (36.7 C) 97.7 F (36.5 C)  98.6 F (37 C)  TempSrc: Oral Oral  Oral  SpO2: 100%   100%  Weight:      Height:        Fundus firm Perineum without swelling.  Lab Results  Component Value Date   WBC 13.7 (H) 05/18/2021   HGB 9.1 (L) 05/18/2021   HCT 27.7 (L) 05/18/2021   MCV 93.6 05/18/2021   PLT 216 05/18/2021    --/--/B POS (10/27 1128)/RI  A/P Post partum day 1.  Routine care.  Expect d/c routine.  Parents desires circumsision.  All risks, benefits and alternatives discussed with the mother.  Iron for anemia.  Pt has had several elevated BPs, highest last night at 150/85 at about 2230.  Since they have been normal to low.  Would keep pt for at least 48 hours to make sure they stay below 140s/90s.  05-11-1979

## 2021-05-19 ENCOUNTER — Inpatient Hospital Stay (HOSPITAL_COMMUNITY): Payer: Medicaid Other

## 2021-05-19 ENCOUNTER — Inpatient Hospital Stay (HOSPITAL_COMMUNITY)
Admission: AD | Admit: 2021-05-19 | Payer: Medicaid Other | Source: Home / Self Care | Admitting: Obstetrics and Gynecology

## 2021-05-19 HISTORY — DX: Umbilical hernia without obstruction or gangrene: K42.9

## 2021-05-19 MED ORDER — IBUPROFEN 600 MG PO TABS: 600.0000 mg | ORAL_TABLET | Freq: Four times a day (QID) | ORAL | 0 refills | Status: DC | PRN

## 2021-05-19 NOTE — Progress Notes (Signed)
Vitals:   05/18/21 0317 05/18/21 0900 05/18/21 1833 05/18/21 2334  BP: 115/76 132/81 123/77 127/74  Pulse: 81 76 74 79  Resp: 20 16 17 18   Temp: 98.6 F (37 C) 98.4 F (36.9 C) 98.1 F (36.7 C) 97.7 F (36.5 C)  TempSrc: Oral Oral Oral Oral  SpO2: 100% 98% 100%   Weight:      Height:         BPs have all been normal over last 24 hours.  Expect d/c in am.

## 2021-05-19 NOTE — Progress Notes (Signed)
CSW met with MOB at bedside. MOB expressed that she does not have a WIC appointment until mid November and requested formula vouchers to take home until her appointment. CSW informed MOB that is something the hospital offers. MOB states she does receive food stamps and will just by formula herself until her appointment. MOB states she had no other needs at this time.   Taylour Lietzke, LCSW Clinical Social Worker  

## 2021-05-19 NOTE — Discharge Summary (Signed)
Postpartum Discharge Summary      Patient Name: Catherine Morales DOB: 1990/10/27 MRN: [id number]  Date of admission: 05/17/2021 Delivery date:05/17/2021  Delivering provider: Huel Cote  Date of discharge: 05/19/2021  Admitting diagnosis: Post term pregnancy [O48.0] NSVD (normal spontaneous vaginal delivery) [O80] Intrauterine pregnancy: [redacted]w[redacted]d     Secondary diagnosis:  Active Problems:   Post term pregnancy   NSVD (normal spontaneous vaginal delivery)     Discharge diagnosis:  postterm pregnancy                                               Post partum procedures: NA Augmentation: AROM Complications: None  Hospital course: Onset of Labor With Vaginal Delivery      30 y.o. yo [id number] at [redacted]w[redacted]d was admitted in Active Labor on 05/17/2021. Patient had an uncomplicated labor course as follows:  Membrane Rupture Time/Date: 1:39 PM ,05/17/2021   Delivery Method:Vaginal, Spontaneous  Episiotomy: None  Lacerations:  None  Patient had an uncomplicated postpartum course.  She is ambulating, tolerating a regular diet, passing flatus, and urinating well. Patient is discharged home in stable condition on 05/19/21.  Newborn Data: Birth date:05/17/2021  Birth time:2:50 PM  Gender:Female  Living status:Living  Apgars:9 ,9  Weight:3799 g   Magnesium Sulfate received: No BMZ received: No Rhophylac:No   Physical exam  Vitals:   05/18/21 0900 05/18/21 1833 05/18/21 2334 05/19/21 0500  BP: 132/81 123/77 127/74 104/60  Pulse: 76 74 79 69  Resp: 16 17 18 18   Temp: 98.4 F (36.9 C) 98.1 F (36.7 C) 97.7 F (36.5 C) 98 F (36.7 C)  TempSrc: Oral Oral Oral   SpO2: 98% 100%    Weight:      Height:       General: alert, cooperative, and no distress Lochia: appropriate Uterine Fundus: firm Incision: N/A DVT Evaluation: No evidence of DVT seen on physical exam. Labs: Lab Results  Component Value Date   WBC 13.7 (H) 05/18/2021   HGB 9.1 (L) 05/18/2021   HCT 27.7 (L)  05/18/2021   MCV 93.6 05/18/2021   PLT 216 05/18/2021   CMP Latest Ref Rng & Units 07/26/2019  Glucose 70 - 99 mg/dL 09/23/2019)  BUN 6 - 20 mg/dL 5(L)  Creatinine 347(Q - 1.00 mg/dL 2.59  Sodium 5.63 - 875 mmol/L 138  Potassium 3.5 - 5.1 mmol/L 3.8  Chloride 98 - 111 mmol/L 106  CO2 22 - 32 mmol/L 24  Calcium 8.9 - 10.3 mg/dL 9.2  Total Protein 6.5 - 8.1 g/dL 6.6  Total Bilirubin 0.3 - 1.2 mg/dL 1.0  Alkaline Phos 38 - 126 U/L 48  AST 15 - 41 U/L 18  ALT 0 - 44 U/L 19   Edinburgh Score: No flowsheet data found.    After visit meds:  Allergies as of 05/19/2021   No Known Allergies      Medication List     STOP taking these medications    aspirin EC 81 MG tablet   terconazole 0.4 % vaginal cream Commonly known as: TERAZOL 7   valACYclovir 500 MG tablet Commonly known as: VALTREX       TAKE these medications    ibuprofen 600 MG tablet Commonly known as: ADVIL Take 1 tablet (600 mg total) by mouth every 6 (six) hours as needed.   prenatal multivitamin Tabs tablet Take  1 tablet by mouth daily at 12 noon.               Discharge Care Instructions  (From admission, onward)           Start     Ordered   05/19/21 0000  Discharge wound care:       Comments: For a cesarean delivery: You may wash incision with soap and water.  Do not soak or submerge the incision for 2 weeks. Keep incision dry. You may need to keep a sanitary pad or panty liner between the incision and your clothing for comfort and to keep the incision dry. If you note drainage, increased pain, or increased redness of the incision, then please notify your physician.   05/19/21 1239   05/19/21 0000  If the dressing is still on your incision site when you go home, remove it on the third day after your surgery date. Remove dressing if it begins to fall off, or if it is dirty or damaged before the third day.       Comments: For a cesarean delivery   05/19/21 1239             Discharge  home in stable condition Infant Feeding: Breast Infant Disposition:home with mother Discharge instruction: per After Visit Summary and Postpartum booklet. Activity: Advance as tolerated. Pelvic rest for 6 weeks.  Diet: routine diet Anticipated Birth Control: Unsure Postpartum Appointment:4 weeks Future Appointments:No future appointments. Follow up Visit:  Follow-up Information     Charlett Nose, MD. Schedule an appointment as soon as possible for a visit in 4 week(s).   Specialty: Obstetrics and Gynecology Why: For a postpartum evaluation Contact information: [address] [place] [address] [phone]                     05/19/2021 Waynard Reeds, MD

## 2021-05-20 ENCOUNTER — Other Ambulatory Visit: Payer: Self-pay

## 2021-05-20 ENCOUNTER — Ambulatory Visit (HOSPITAL_COMMUNITY)
Admission: EM | Admit: 2021-05-20 | Discharge: 2021-05-20 | Disposition: A | Discharge status: Home / Self Care | Payer: Medicaid Other | Attending: Emergency Medicine | Admitting: Emergency Medicine

## 2021-05-20 ENCOUNTER — Encounter (HOSPITAL_COMMUNITY): Payer: Self-pay | Admitting: Emergency Medicine

## 2021-05-20 DIAGNOSIS — H6123 Impacted cerumen, bilateral: Secondary | ICD-10-CM

## 2021-05-20 NOTE — ED Triage Notes (Signed)
Bilat ear fullness x 4 days.

## 2021-05-20 NOTE — Discharge Instructions (Addendum)
You can use Debrox to help with excessive earwax buildup.  Do not use any Q-tips in your ear canal.  Return or go to the Emergency Department if symptoms worsen or do not improve in the next few days.

## 2021-05-20 NOTE — ED Provider Notes (Signed)
[facility]    CSN: [id number] Arrival date & time: 05/20/21  1703      History   Chief Complaint Chief Complaint  Patient presents with   Ear Fullness    HPI Catherine Morales is a 30 y.o. female.   Patient here for evaluation of bilateral ear fullness and decreased hearing that has been ongoing for the past 4 days.  Has not taken any OTC medications or treatments.  Denies any pain or discharge.  Denies any recent sick contacts.  Denies any trauma, injury, or other precipitating event.  Denies any specific alleviating or aggravating factors.  Denies any fevers, chest pain, shortness of breath, N/V/D, numbness, tingling, weakness, abdominal pain, or headaches.    The history is provided by the patient.  Ear Fullness   Past Medical History:  Diagnosis Date   Abnormal uterine bleeding 01/29/2018   BV (bacterial vaginosis)    Chlamydia    Chronic low back pain 04/21/2015   Genital herpes simplex type 2 05/11/2020   Culture positive   HPV in female    Pyelonephritis 04/02/2015   Scoliosis    Trichomonal vaginitis 10/16/2020   Umbilical hernia     Patient Active Problem List   Diagnosis Date Noted   Post term pregnancy 05/17/2021   NSVD (normal spontaneous vaginal delivery) 05/17/2021   Genital herpes simplex type 2 05/11/2020   ASCUS with positive high risk HPV cervical on 01/29/18 01/29/2018    Past Surgical History:  Procedure Laterality Date   WISDOM TOOTH EXTRACTION      OB History     Gravida  3   Para  2   Term  2   Preterm      AB  1   Living  2      SAB      IAB  1   Ectopic      Multiple  0   Live Births  1        Obstetric Comments  Age 89 with first baby          Home Medications    Prior to Admission medications   Medication Sig Start Date End Date Taking? Authorizing Provider  ibuprofen (ADVIL) 600 MG tablet Take 1 tablet (600 mg total) by mouth every 6 (six) hours as needed. 05/19/21   Waynard Reeds, MD   Prenatal Vit-Fe Fumarate-FA (PRENATAL MULTIVITAMIN) TABS tablet Take 1 tablet by mouth daily at 12 noon.    [provider]    Family History Family History  Problem Relation Age of Onset   Cancer Maternal Grandmother    Cancer Paternal Grandmother    Healthy Mother    Healthy Father     Social History Social History   Tobacco Use   Smoking status: Every Day    Packs/day: 0.30    Types: Cigarettes   Smokeless tobacco: Never  Vaping Use   Vaping Use: Never used  Substance Use Topics   Alcohol use: Not Currently    Alcohol/week: 0.0 standard drinks    Comment: socially    Drug use: No     Allergies   Patient has no known allergies.   Review of Systems Review of Systems  HENT:  Positive for hearing loss. Negative for ear discharge and ear pain.   All other systems reviewed and are negative.   Physical Exam Triage Vital Signs ED Triage Vitals  Enc Vitals Group     BP 05/20/21 1759 126/84  Pulse Rate 05/20/21 1759 82     Resp 05/20/21 1759 16     Temp 05/20/21 1759 98.4 F (36.9 C)     Temp src --      SpO2 05/20/21 1759 96 %     Weight --      Height --      Head Circumference --      Peak Flow --      Pain Score 05/20/21 1757 0     Pain Loc --      Pain Edu? --      Excl. in GC? --    No data found.  Updated Vital Signs BP 126/84 (BP Location: Right Arm)   Pulse 82   Temp 98.4 F (36.9 C)   Resp 16   LMP 07/29/2020   SpO2 96%   Breastfeeding No   Visual Acuity Right Eye Distance:   Left Eye Distance:   Bilateral Distance:    Right Eye Near:   Left Eye Near:    Bilateral Near:     Physical Exam Vitals and nursing note reviewed.  Constitutional:      General: She is not in acute distress.    Appearance: Normal appearance. She is not ill-appearing, toxic-appearing or diaphoretic.  HENT:     Head: Normocephalic and atraumatic.     Right Ear: There is impacted cerumen.     Left Ear: There is impacted cerumen.  Eyes:      Conjunctiva/sclera: Conjunctivae normal.  Cardiovascular:     Rate and Rhythm: Normal rate.     Pulses: Normal pulses.  Pulmonary:     Effort: Pulmonary effort is normal.  Abdominal:     General: Abdomen is flat.  Musculoskeletal:        General: Normal range of motion.     Cervical back: Normal range of motion.  Skin:    General: Skin is warm and dry.  Neurological:     General: No focal deficit present.     Mental Status: She is alert and oriented to person, place, and time.  Psychiatric:        Mood and Affect: Mood normal.     UC Treatments / Results  Labs (all labs ordered are listed, but only abnormal results are displayed) Labs Reviewed - No data to display  EKG   Radiology No results found.  Procedures Procedures (including critical care time)  Medications Ordered in UC Medications - No data to display  Initial Impression / Assessment and Plan / UC Course  I have reviewed the triage vital signs and the nursing notes.  Pertinent labs & imaging results that were available during my care of the patient were reviewed by me and considered in my medical decision making (see chart for details).    Bilateral ear lavage performed in office.  Post lavage tympanic membranes and canals clear with no signs of infection.  Patient reports decreased feeling of fullness and increased hearing.  Discussed that patient may use Debrox for excessive earwax buildup and that she should not use Q-tips in her ear canal.  Follow-up with primary care for reevaluation. Final Clinical Impressions(s) / UC Diagnoses   Final diagnoses:  Bilateral impacted cerumen     Discharge Instructions      You can use Debrox to help with excessive earwax buildup.  Do not use any Q-tips in your ear canal.  Return or go to the Emergency Department if symptoms worsen or do not improve in  the next few days.      ED Prescriptions   None    PDMP not reviewed this encounter.   Ivette Loyal, NP 05/20/21 [phone]

## 2021-05-21 ENCOUNTER — Telehealth: Payer: Self-pay

## 2021-05-21 NOTE — Telephone Encounter (Signed)
Transition Care Management Follow-up Telephone Call Date of discharge and from where: 05/19/2021-[facility]  How have you been since you were released from the hospital? Patient stated she is doing fine.  Any questions or concerns? No  Items Reviewed: Did the pt receive and understand the discharge instructions provided? Yes  Medications obtained and verified? Yes  Other? No  Any new allergies since your discharge? No  Dietary orders reviewed? No Do you have support at home? Yes   Home Care and Equipment/Supplies: Were home health services ordered? not applicable If so, what is the name of the agency? N/A  Has the agency set up a time to come to the patient's home? not applicable Were any new equipment or medical supplies ordered?  No What is the name of the medical supply agency? N/A Were you able to get the supplies/equipment? not applicable Do you have any questions related to the use of the equipment or supplies? No  Functional Questionnaire: (I = Independent and D = Dependent) ADLs: I  Bathing/Dressing- I  Meal Prep- I  Eating- I  Maintaining continence- I  Transferring/Ambulation- I  Managing Meds- I  Follow up appointments reviewed:  PCP Hospital f/u appt confirmed? No   Specialist Hospital f/u appt confirmed? No   Are transportation arrangements needed? No  If their condition worsens, is the pt aware to call PCP or go to the Emergency Dept.? Yes Was the patient provided with contact information for the PCP's office or ED? Yes Was to pt encouraged to call back with questions or concerns? Yes  

## 2021-05-29 ENCOUNTER — Telehealth (HOSPITAL_COMMUNITY): Payer: Self-pay

## 2021-05-29 NOTE — Progress Notes (Signed)
Phone line disconnected after question #2. Attempted to call patient back x2 with no answer. Voicemail left to return nurse call.

## 2021-05-29 NOTE — Telephone Encounter (Signed)
"  I'm doing great." Patient has no questions or concerns about her healing.  "Everything is fine. He is eating and gaining weight. He sleeps in a bassinet." RN reviewed ABC's of safe sleep with patient. Patient declines any questions or concerns about baby.  EPDS- phone line disconnected after question #2. Attempted to call patient back x2 with no answer. Voicemail left to return nurse call.   Marcelino Duster [facility] 05/29/2021,1538

## 2021-06-28 ENCOUNTER — Other Ambulatory Visit: Payer: Self-pay | Admitting: Obstetrics & Gynecology

## 2021-06-28 DIAGNOSIS — A6000 Herpesviral infection of urogenital system, unspecified: Secondary | ICD-10-CM

## 2021-07-09 ENCOUNTER — Other Ambulatory Visit: Payer: Self-pay

## 2021-07-10 ENCOUNTER — Emergency Department (HOSPITAL_COMMUNITY)
Admission: EM | Admit: 2021-07-10 | Discharge: 2021-07-10 | Discharge status: Against Medical Advice | Payer: Medicaid Other

## 2021-07-10 NOTE — ED Notes (Signed)
Patient seen throwing away labels and leaving

## 2021-11-12 IMAGING — US US MFM OB DETAIL+14 WK
1 series · 13 of 28 positions shown · non-contrast
Comparison: none

[Series 1: us mfm ob detail+14 wk · 130 acquisitions, 13 frames shown]
[im 5/130]
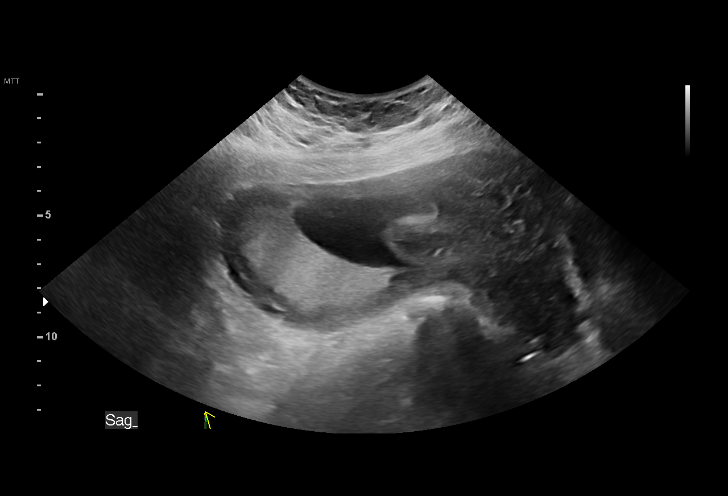
[im 15/130]
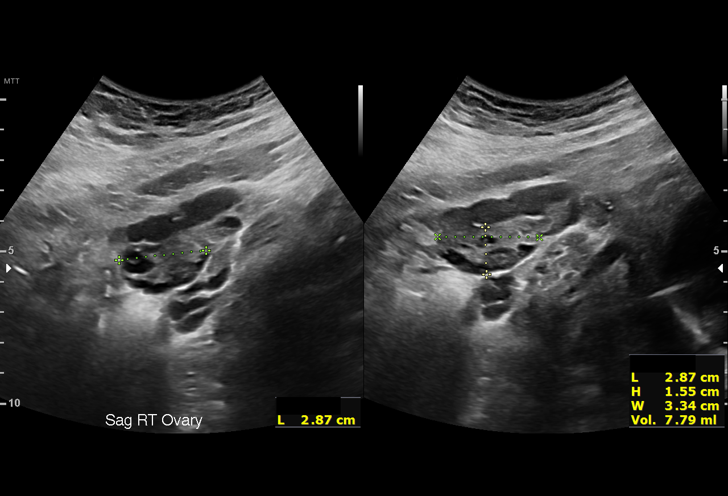
[im 24/130]
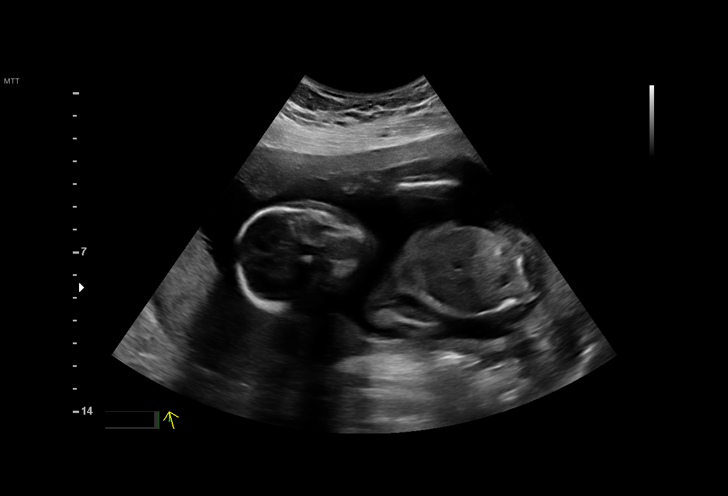
[im 34/130]
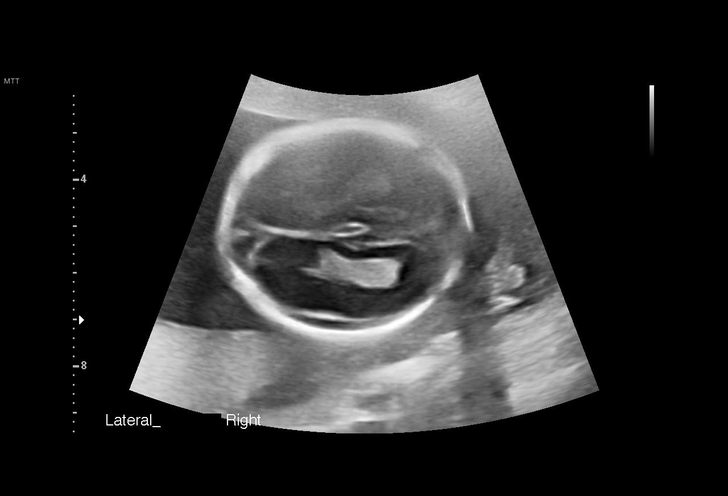
[im 44/130]
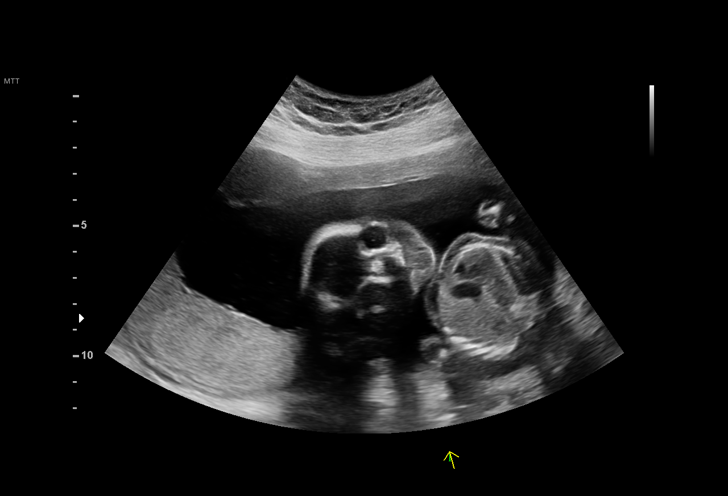
[im 53/130]
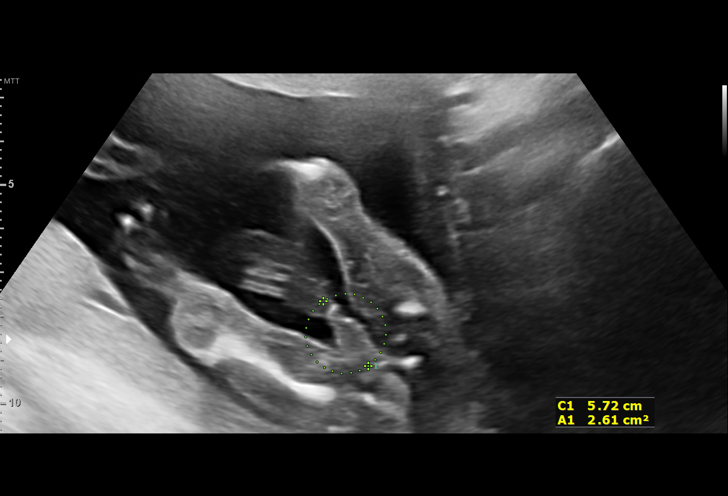
[im 67/130]
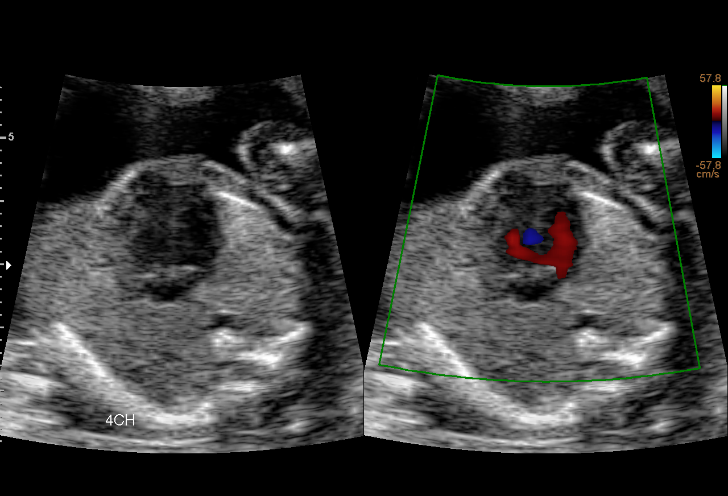
[im 77/130]
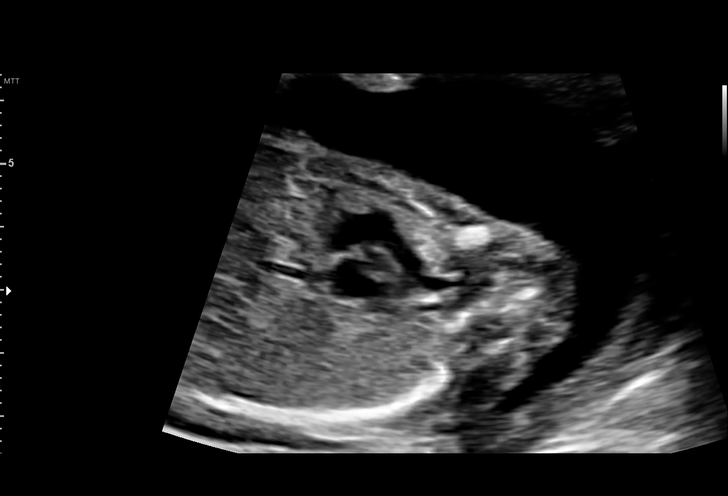
[im 87/130]
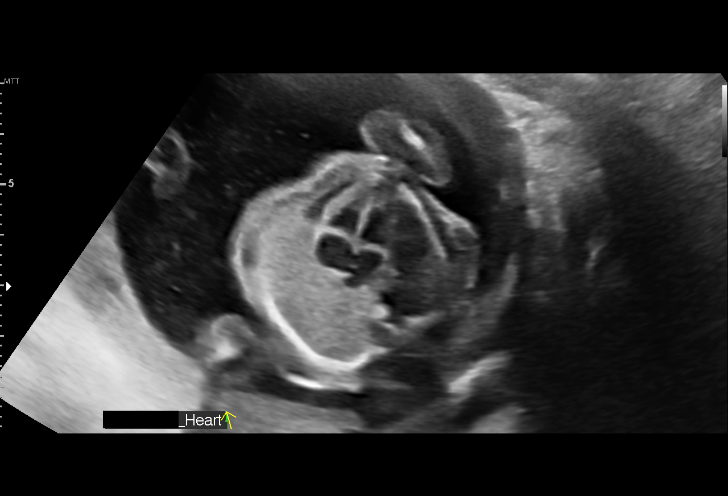
[im 96/130]
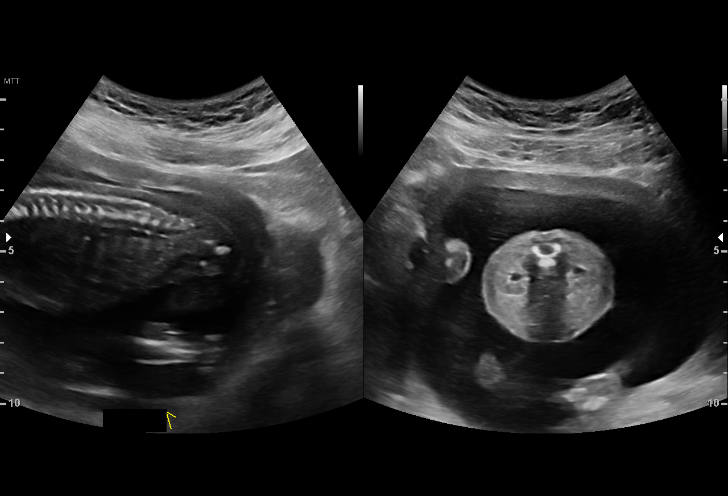
[im 106/130]
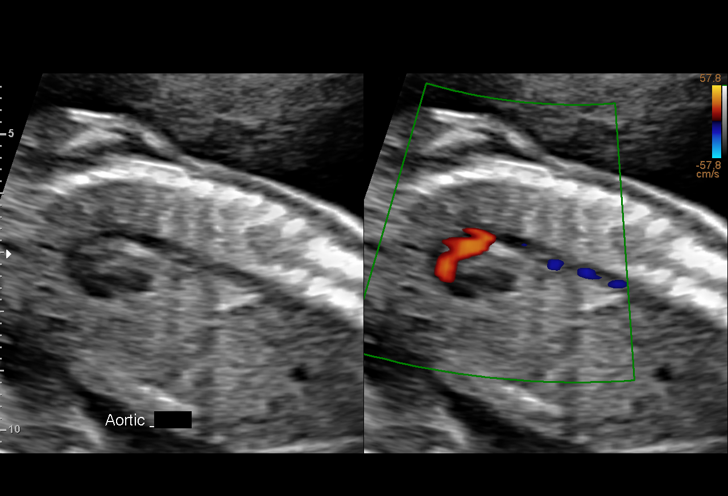
[im 115/130]
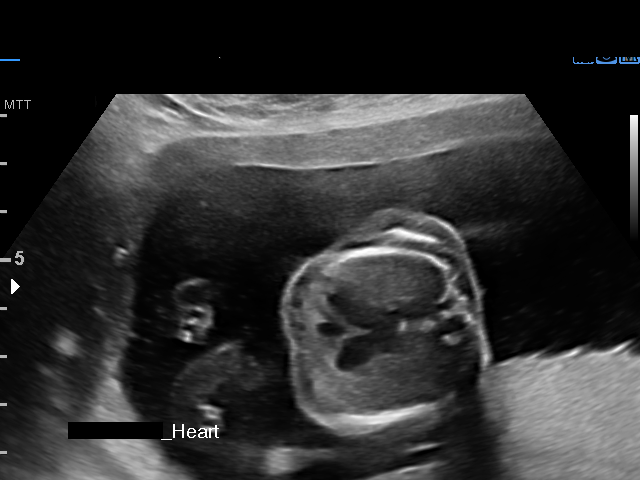
[im 125/130]
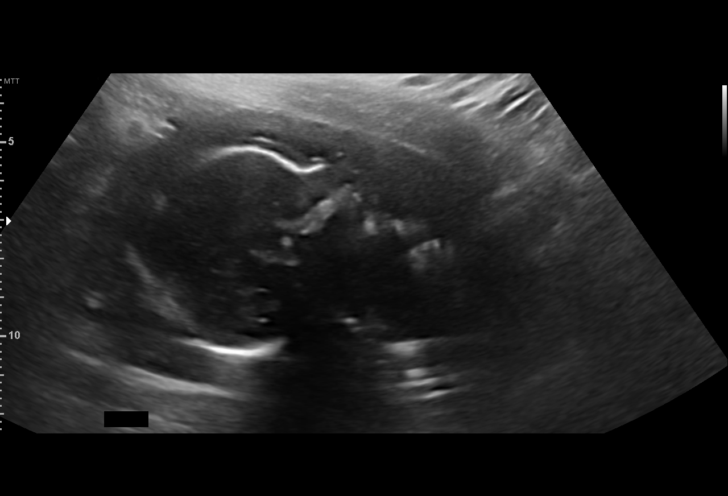

[13 of 28 positions shown; findings below may reference images not displayed]

OBGYN
                                                            [REDACTED]
                   CABA

Indications

 19 weeks gestation of pregnancy
 Obesity complicating pregnancy, second
 trimester (Prepreg BMI 34)
 Herpes simplex virus (CIASTA)
 Tobacco use complicating pregnancy,
 second trimester
 Encounter for antenatal screening for
 malformations
Fetal Evaluation

 Num Of Fetuses:         1
 Fetal Heart Rate(bpm):  147
 Cardiac Activity:       Observed
 Presentation:           Breech
 Placenta:               Posterior
 P. Cord Insertion:      Visualized, central

 Amniotic Fluid
 AFI FV:      Within normal limits

                             Largest Pocket(cm)

Biometry
 BPD:      46.8  mm     G. Age:  20w 1d         83  %    CI:        81.44   %    70 - 86
                                                         FL/HC:      18.9   %    16.1 -
 HC:      163.7  mm     G. Age:  19w 1d         34  %    HC/AC:      1.08        1.09 -
 AC:      151.4  mm     G. Age:  20w 3d         79  %    FL/BPD:     66.2   %
 FL:         31  mm     G. Age:  19w 4d         55  %    FL/AC:      20.5   %    20 - 24
 HUM:      29.2  mm     G. Age:  19w 4d         57  %
 CER:      20.4  mm     G. Age:  19w 4d         62  %
 NFT:       4.3  mm
 LV:          6  mm
 CM:        6.5  mm

 Est. FW:     322  gm    0 lb 11 oz      82  %
OB History

 Blood Type:   B+
 Gravidity:    3         Term:   1
 TOP:          1        Living:  1
Gestational Age

 LMP:           20w 4d        Date:  07/29/20                 EDD:   05/05/21
 U/S Today:     19w 6d                                        EDD:   05/10/21
 Best:          19w 2d     Det. By:  Early Ultrasound         EDD:   05/14/21
                                     (09/20/20)
Anatomy

 Cranium:               Appears normal         LVOT:                   Appears normal
 Cavum:                 Appears normal         Aortic Arch:            Not well visualized
 Ventricles:            Appears normal         Ductal Arch:            Not well visualized
 Choroid Plexus:        Appears normal         Diaphragm:              Appears normal
 Cerebellum:            Appears normal         Stomach:                Appears normal, left
                                                                       sided
 Posterior Fossa:       Appears normal         Abdomen:                Appears normal
 Nuchal Fold:           Appears normal         Abdominal Wall:         Appears nml (cord
                                                                       insert, abd wall)
 Face:                  Appears normal         Cord Vessels:           Appears normal (3
                        (orbits and profile)                           vessel cord)
 Lips:                  Appears normal         Kidneys:                Appear normal
 Palate:                Not well visualized    Bladder:                Appears normal
 Thoracic:              Appears normal         Spine:                  Appears normal
 Heart:                 Appears normal         Upper Extremities:      Appears normal
                        (4CH, axis, and
                        situs)
 RVOT:                  Appears normal         Lower Extremities:      Appears normal

 Other:  Fetus appears to be a male. Nasal bone visualized. Technically
         difficult due to fetal position.
Cervix Uterus Adnexa

 Cervix
 Length:           3.91  cm.
 Normal appearance by transabdominal scan.
 Uterus
 Single fibroid noted, see table below.

 Right Ovary
 Within normal limits.

 Left Ovary
 Within normal limits.

 Cul De Sac
 No free fluid seen.

 Adnexa
 No abnormality visualized.

 Comment
 ? Maternal Umbilical hernia? noted in soft
 tissues in tender region at umbilicus.
 Heterogenous region approximately 4.5 x 4.0 x
 5.0 cm.
Myomas

 Site                     L(cm)      W(cm)      D(cm)       Location
 Anterior                 2.5        1.9        2.1         Intramural

 Blood Flow                  RI       PI       Comments

Impression

 G3 P1.  Patient is here for fetal anatomy scan.
 On cell-free fetal DNA screening, the risks of aneuploidies
 are not increased.
 We performed fetal anatomy scan. No makers of
 aneuploidies or fetal structural defects are seen. Fetal
 biometry is consistent with her previously-established dates.
 Amniotic fluid is normal and good fetal activity is seen.
 Patient understands the limitations of ultrasound in detecting
 fetal anomalies.
 A small anterior intramural myoma is seen.
 Patient has umbilical hernia that created an ill-defined
 heterogeneous mass (measurements above).  It is not tender.
Recommendations

 -An appointment was made for her to return in 4 weeks for
 completion of fetal anatomy.
                 Rozac, Maksija

## 2021-12-27 DIAGNOSIS — Z3480 Encounter for supervision of other normal pregnancy, unspecified trimester: Secondary | ICD-10-CM | POA: Diagnosis not present

## 2021-12-27 DIAGNOSIS — O26841 Uterine size-date discrepancy, first trimester: Secondary | ICD-10-CM | POA: Diagnosis not present

## 2022-01-06 ENCOUNTER — Inpatient Hospital Stay (HOSPITAL_COMMUNITY): Payer: Medicaid Other

## 2022-01-06 ENCOUNTER — Encounter (HOSPITAL_COMMUNITY): Payer: Self-pay | Admitting: Obstetrics & Gynecology

## 2022-01-06 ENCOUNTER — Inpatient Hospital Stay (HOSPITAL_COMMUNITY)
Admission: AD | Admit: 2022-01-06 | Discharge: 2022-01-06 | Disposition: A | Discharge status: Home / Self Care | Payer: Medicaid Other | Attending: Obstetrics and Gynecology | Admitting: Obstetrics and Gynecology

## 2022-01-06 DIAGNOSIS — O209 Hemorrhage in early pregnancy, unspecified: Secondary | ICD-10-CM | POA: Diagnosis not present

## 2022-01-06 DIAGNOSIS — Z3A11 11 weeks gestation of pregnancy: Secondary | ICD-10-CM

## 2022-01-06 DIAGNOSIS — N939 Abnormal uterine and vaginal bleeding, unspecified: Secondary | ICD-10-CM | POA: Diagnosis not present

## 2022-01-06 DIAGNOSIS — Z3A01 Less than 8 weeks gestation of pregnancy: Secondary | ICD-10-CM | POA: Diagnosis not present

## 2022-01-06 DIAGNOSIS — O039 Complete or unspecified spontaneous abortion without complication: Secondary | ICD-10-CM | POA: Diagnosis not present

## 2022-01-06 DIAGNOSIS — O26891 Other specified pregnancy related conditions, first trimester: Secondary | ICD-10-CM | POA: Diagnosis not present

## 2022-01-06 LAB — CBC
HCT: 33.3 % — ABNORMAL LOW (ref 36.0–46.0)
Hemoglobin: 11 g/dL — ABNORMAL LOW (ref 12.0–15.0)
MCH: 31.7 pg (ref 26.0–34.0)
MCHC: 33 g/dL (ref 30.0–36.0)
MCV: 96 fL (ref 80.0–100.0)
Platelets: 265 10*3/uL (ref 150–400)
RBC: 3.47 MIL/uL — ABNORMAL LOW (ref 3.87–5.11)
RDW: 12.5 % (ref 11.5–15.5)
WBC: 8.1 10*3/uL (ref 4.0–10.5)
nRBC: 0 % (ref 0.0–0.2)

## 2022-01-06 LAB — TYPE AND SCREEN
ABO/RH(D): B POS
Antibody Screen: NEGATIVE

## 2022-01-06 LAB — HCG, QUANTITATIVE, PREGNANCY: hCG, Beta Chain, Quant, S: 2468 m[IU]/mL — ABNORMAL HIGH (ref <5)

## 2022-01-06 MED ORDER — MISOPROSTOL 200 MCG PO TABS
800.0000 ug | ORAL_TABLET | Freq: Once | ORAL | Status: AC
  Administered 2022-01-06: 800 ug via BUCCAL
  Filled 2022-01-06: qty 4

## 2022-01-06 MED ORDER — PROMETHAZINE HCL 25 MG/ML IJ SOLN
12.5000 mg | Freq: Once | INTRAVENOUS | Status: DC
  Filled 2022-01-06: qty 0.5

## 2022-01-06 MED ORDER — HYDROMORPHONE HCL 1 MG/ML IJ SOLN
2.0000 mg | INTRAMUSCULAR | Status: DC | PRN
  Administered 2022-01-06: 2 mg via INTRAVENOUS
  Filled 2022-01-06: qty 2

## 2022-01-06 MED ORDER — IBUPROFEN 600 MG PO TABS: 600.0000 mg | ORAL_TABLET | Freq: Four times a day (QID) | ORAL | 1 refills | Status: AC | PRN

## 2022-01-06 MED ORDER — LACTATED RINGERS IV BOLUS
1000.0000 mL | Freq: Once | INTRAVENOUS | Status: AC
  Administered 2022-01-06: 1000 mL via INTRAVENOUS

## 2022-01-06 MED ORDER — HYDROMORPHONE HCL 1 MG/ML IJ SOLN
1.0000 mg | Freq: Once | INTRAMUSCULAR | Status: AC
  Administered 2022-01-06: 1 mg via INTRAVENOUS
  Filled 2022-01-06: qty 1

## 2022-01-06 NOTE — MAU Provider Note (Signed)
Chief Complaint: Vaginal Bleeding and Abdominal Cramping   None     SUBJECTIVE HPI: Catherine Morales is a 31 y.o. [phone] with dx of missed ab by [place] at [facility] on 12/27/21 reporting heavy vaginal bleeding and severe cramping today. She had no FHR on [place] on 6/8, with plan to return to the office in 1 week for [place] but started bleeding on 01/01/22 and had heavy bleeding with clots x 3 days. Bleeding then slowed down and stopped until today, when it became heavy again. Her pain is severe, she is rocking and moaning in MAU.  She reports feeling pressure, like she needs to push.     HPI  Past Medical History:  Diagnosis Date   Abnormal uterine bleeding 01/29/2018   BV (bacterial vaginosis)    Chlamydia    Chronic low back pain 04/21/2015   Genital herpes simplex type 2 05/11/2020   Culture positive   HPV in female    Pyelonephritis 04/02/2015   Scoliosis    Trichomonal vaginitis 10/16/2020   Umbilical hernia    Past Surgical History:  Procedure Laterality Date   WISDOM TOOTH EXTRACTION     Social History   Socioeconomic History   Marital status: Single    Spouse name: Not on file   Number of children: Not on file   Years of education: Not on file   Highest education level: Not on file  Occupational History   Not on file  Tobacco Use   Smoking status: Every Day    Packs/day: 0.30    Types: Cigarettes   Smokeless tobacco: Never  Vaping Use   Vaping Use: Never used  Substance and Sexual Activity   Alcohol use: Not Currently    Alcohol/week: 0.0 standard drinks of alcohol    Comment: socially    Drug use: No   Sexual activity: Yes    Birth control/protection: None  Other Topics Concern   Not on file  Social History Narrative   Not on file   Social Determinants of Health   Financial Resource Strain: Not on file  Food Insecurity: Not on file  Transportation Needs: Not on file  Physical Activity: Not on file  Stress: Not on file  Social Connections: Not on  file  Intimate Partner Violence: Not on file   No current facility-administered medications on file prior to encounter.   Current Outpatient Medications on File Prior to Encounter  Medication Sig Dispense Refill   acetaminophen (TYLENOL) 500 MG tablet Take 1,000 mg by mouth every 6 (six) hours as needed for moderate pain.     Prenatal Vit-Fe Fumarate-FA (PRENATAL MULTIVITAMIN) TABS tablet Take 1 tablet by mouth daily at 12 noon.     ibuprofen (ADVIL) 600 MG tablet Take 1 tablet (600 mg total) by mouth every 6 (six) hours as needed. 90 tablet 0   No Known Allergies  ROS:  Review of Systems  Constitutional:  Negative for chills, fatigue and fever.  Respiratory:  Negative for shortness of breath.   Cardiovascular:  Negative for chest pain.  Gastrointestinal:  Positive for abdominal pain.  Genitourinary:  Positive for pelvic pain and vaginal bleeding. Negative for difficulty urinating, dysuria, flank pain, vaginal discharge and vaginal pain.  Neurological:  Negative for dizziness and headaches.  Psychiatric/Behavioral: Negative.       I have reviewed patient's Past Medical Hx, Surgical Hx, Family Hx, Social Hx, medications and allergies.   Physical Exam  Patient Vitals for the past 24 hrs:  BP Temp Temp src Pulse Resp SpO2  01/06/22 0753 -- 98.6 F (37 C) Oral -- 18 100 %  01/06/22 0212 105/66 98 F (36.7 C) Oral 66 17 100 %   Constitutional: Well-developed, well-nourished female in no acute distress.  Cardiovascular: normal rate Respiratory: normal effort GI: Abd soft, non-tender. Pos BS x 4 MS: Extremities nontender, no edema, normal ROM Neurologic: Alert and oriented x 4.  GU: Neg CVAT.  PELVIC EXAM:  Large amount dark red bleeding with small clots, 5-6 fox swab used and CNM unable to visualize cervix due to active bleeding, vaginal walls and external genitalia normal   LAB RESULTS Results for orders placed or performed during the hospital encounter of 01/06/22 (from the  past 24 hour(s))  CBC     Status: Abnormal   Collection Time: 01/06/22  2:29 AM  Result Value Ref Range   WBC 8.1 4.0 - 10.5 K/uL   RBC 3.47 (L) 3.87 - 5.11 MIL/uL   Hemoglobin 11.0 (L) 12.0 - 15.0 g/dL   HCT 32.9 (L) 51.8 - 84.1 %   MCV 96.0 80.0 - 100.0 fL   MCH 31.7 26.0 - 34.0 pg   MCHC 33.0 30.0 - 36.0 g/dL   RDW 66.0 63.0 - 16.0 %   Platelets 265 150 - 400 K/uL   nRBC 0.0 0.0 - 0.2 %  Type and screen     Status: None   Collection Time: 01/06/22  2:29 AM  Result Value Ref Range   ABO/RH(D) B POS    Antibody Screen NEG    Sample Expiration      01/09/2022,2359 Performed at [facility], [address], [place], [place] [address]   hCG, quantitative, pregnancy     Status: Abnormal   Collection Time: 01/06/22  2:29 AM  Result Value Ref Range   hCG, Beta Chain, Quant, S 2,468 (H) <5 mIU/mL    --/--/B POS (06/18 0229)  IMAGING US OB LESS THAN 14 WEEKS WITH OB TRANSVAGINAL  Result Date: 01/06/2022 CLINICAL DATA:  31 year old female with heavy vaginal bleeding pain. Quantitative beta HCG pending. EXAM: OBSTETRIC <14 WK [place] AND TRANSVAGINAL OB US TECHNIQUE: Both transabdominal and transvaginal ultrasound examinations were performed for complete evaluation of the gestation as well as the maternal uterus, adnexal regions, and pelvic cul-de-sac. Transvaginal technique was performed to assess early pregnancy. COMPARISON:  None relevant. FINDINGS: Intrauterine gestational sac: Single, irregular contour and located in the lower uterine segment (image 10). Yolk sac:  Not identified Embryo:  Not identified Cardiac Activity: Not applicable MSD: 16.8 mm   6 w   4 d Subchorionic hemorrhage: Suspected large volume of blood surrounding the gestational sac in the endometrial cavity (image 10). Maternal uterus/adnexae: No pelvic free fluid. Neither ovary could be identified on initial transabdominal or transvaginal attempts, and the examination had to be discontinued prior to completion due to  pain. IMPRESSION: 1. Irregular intrauterine gestational sac located in the lower uterine segment, with suspected large volume of surrounding endometrial hemorrhage. No yolk sac or embryo identified. Suspect spontaneous abortion in progress. Recommend serial quantitative beta HCG and repeat Ultrasound as necessary. 2. No pelvic free fluid. Neither ovary could be identified initially and the examination had to be discontinued due to pain. Electronically Signed   By: Odessa Fleming M.D.   On: 01/06/2022 04:24    MAU Management/MDM: Orders Placed This Encounter  Procedures   US OB LESS THAN 14 WEEKS WITH OB TRANSVAGINAL   CBC  hCG, quantitative, pregnancy   Type and screen   Discharge patient    Meds ordered this encounter  Medications   lactated ringers bolus 1,000 mL   HYDROmorphone (DILAUDID) injection 1 mg   HYDROmorphone (DILAUDID) injection 1 mg   misoprostol (CYTOTEC) tablet 800 mcg   HYDROmorphone (DILAUDID) injection 2 mg   promethazine (PHENERGAN) 12.5 mg in lactated ringers 1,000 mL infusion   ibuprofen (ADVIL) 600 MG tablet    Sig: Take 1 tablet (600 mg total) by mouth every 6 (six) hours as needed.    Dispense:  30 tablet    Refill:  1    Order Specific Question:   Supervising Provider    Answer:   Adam Phenix [[id number]]    Pt with heavy bleeding, but Hgb stable at 11.0 and VS wnl.   US shows gestational sac in lower uterine segment, c/w miscarriage in process Pt pain not controlled by 1 mg Dilaudid IV so second dose of 1 mg given in ~ 1 hour. Pt pain improved slightly but still significant pain.   Discussed options with pt, given normal Hgb, Cytotec buccal in MAU and wait for pt to pass POCs or D&C Pt desires D&C.  Consult Dr Debroah Loop with assessment and findings.   Called Dr Tenny Craw with pt preference for [facility] but stable labs/VS Per Dr Tenny Craw, pt can be observed in MAU and may pass POCs without treatment.   Discussed with pt the plan to wait 2 hours with or without Cytotec and the pt  opted to try Cytotec with more pain medication.  Within 1 hour of Cytotec administration, pt passed large clot in the toilet and afterwards, bleeding slowed Observed for an additional hour and scant bleeding on pad D/C home F/U in office in 1-2 weeks Bleeding precautions reviewed   ASSESSMENT 1. SAB (spontaneous abortion)   2. Vaginal bleeding in pregnancy, first trimester   3. [redacted] weeks gestation of pregnancy     PLAN Discharge home Allergies as of 01/06/2022   No Known Allergies      Medication List     TAKE these medications    acetaminophen 500 MG tablet Commonly known as: TYLENOL Take 1,000 mg by mouth every 6 (six) hours as needed for moderate pain.   ibuprofen 600 MG tablet Commonly known as: ADVIL Take 1 tablet (600 mg total) by mouth every 6 (six) hours as needed.   prenatal multivitamin Tabs tablet Take 1 tablet by mouth daily at 12 noon.        Follow-up Information     Ob/Gyn, [facility] Ramp Follow up in 2 week(s).   Why: Call to make an appointment. Contact information: [address] [place] [address] [phone]                 Sharen Counter Certified Nurse-Midwife 01/06/2022  8:28 AM

## 2022-01-06 NOTE — MAU Note (Signed)
.  Catherine Morales is a 31 y.o. at Unknown here in MAU reporting: Pt reports that she had + pregnancy tests at home and [facility], pt reports having an ultrasound and was told there wasn't a heart beat. June 12th patient began bleeding and cramping. Pt in lobby, peripad from from noted to be saturated with bright red blood. Pt currently having painful cramping and vaginal rectal pressure. "It feels like I need to push something out." Pt states since Tuesday her bleeding has been heavy with clots. Reports saturating 3 pads in an hour with clots.  Onset of complaint: 12/31/2021 Pain score: 10 Vitals:   01/06/22 0212  BP: 105/66  Pulse: 66  Resp: 17  Temp: 98 F (36.7 C)  SpO2: 100%

## 2022-11-29 IMAGING — US US OB < 14 WEEKS - US OB TV
1 series · 13 of 13 positions shown · non-contrast
Comparison: None relevant.

CLINICAL DATA: 31-year-old female with heavy vaginal bleeding pain.
Quantitative beta HCG pending.

EXAM:
OBSTETRIC <14 WK US AND TRANSVAGINAL OB US
TECHNIQUE: Both transabdominal and transvaginal ultrasound examinations were
performed for complete evaluation of the gestation as well as the
maternal uterus, adnexal regions, and pelvic cul-de-sac.
Transvaginal technique was performed to assess early pregnancy.

[Series 1: us ob < 14 weeks - us ob tv · 13 acquisitions, 13 frames shown]
[im 1/13]
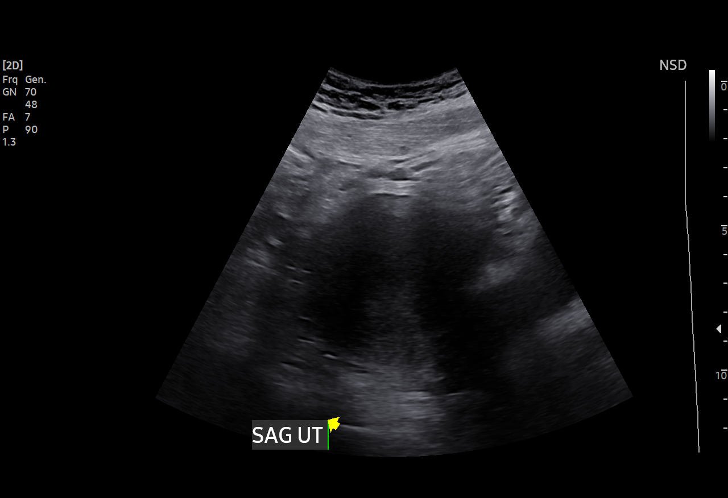
[im 2/13]
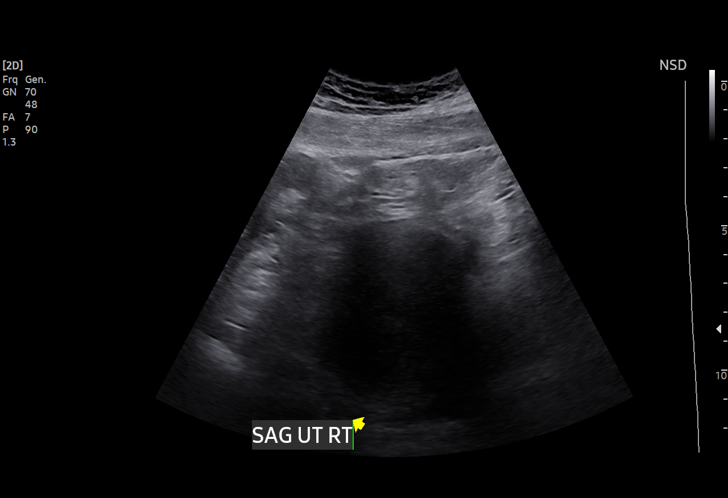
[im 3/13]
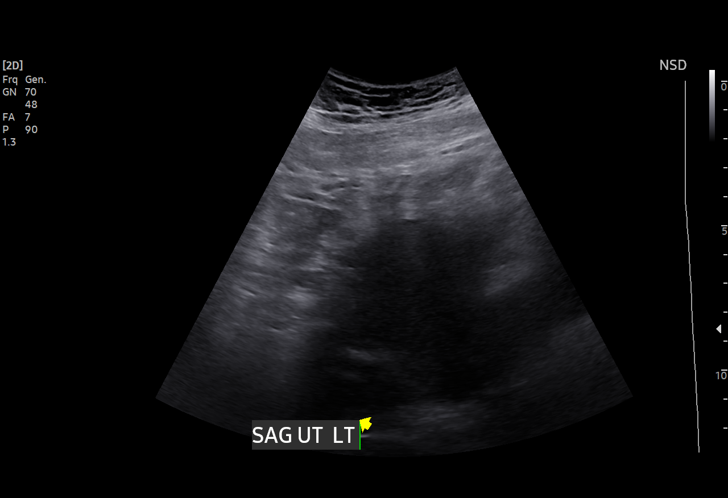
[im 4/13]
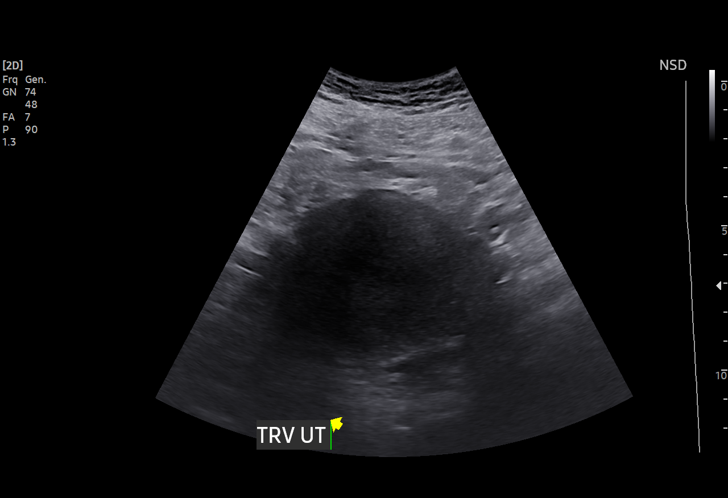
[im 5/13]
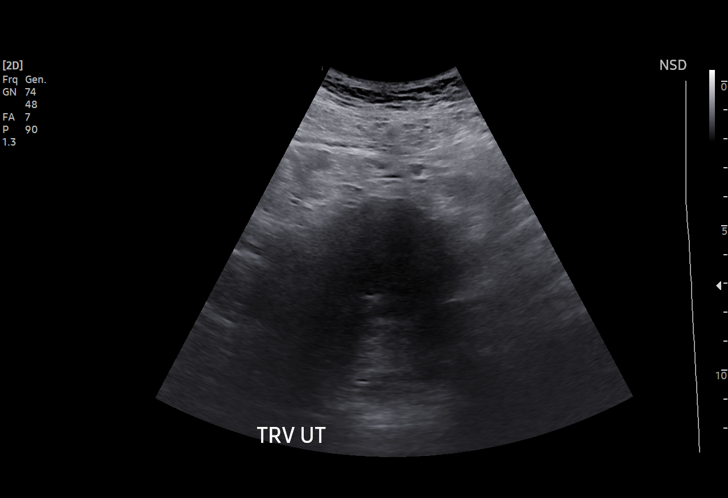
[im 6/13]
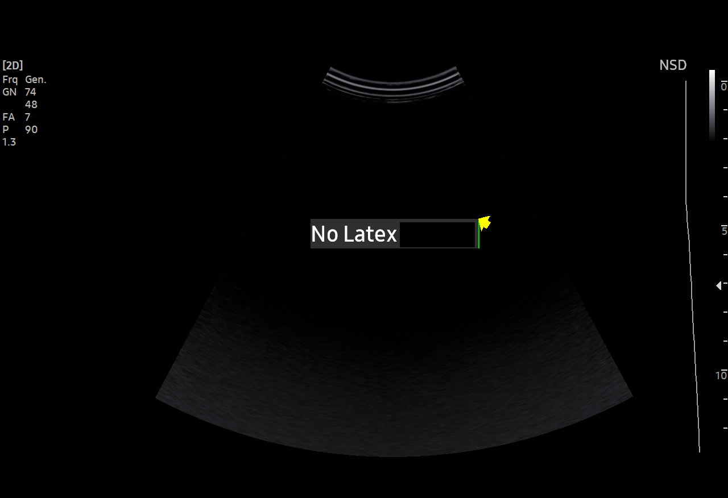
[im 7/13]
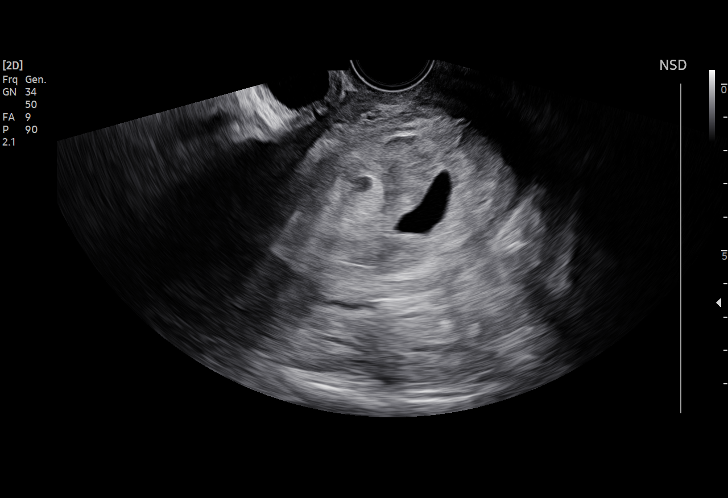
[im 8/13]
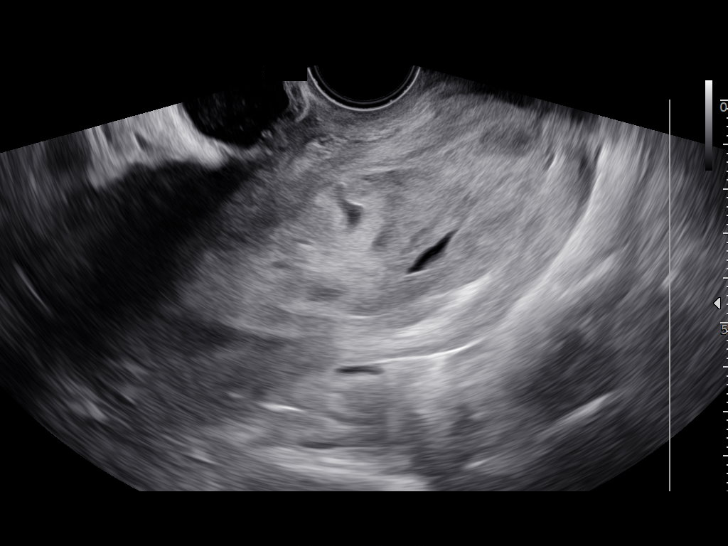
[im 9/13]
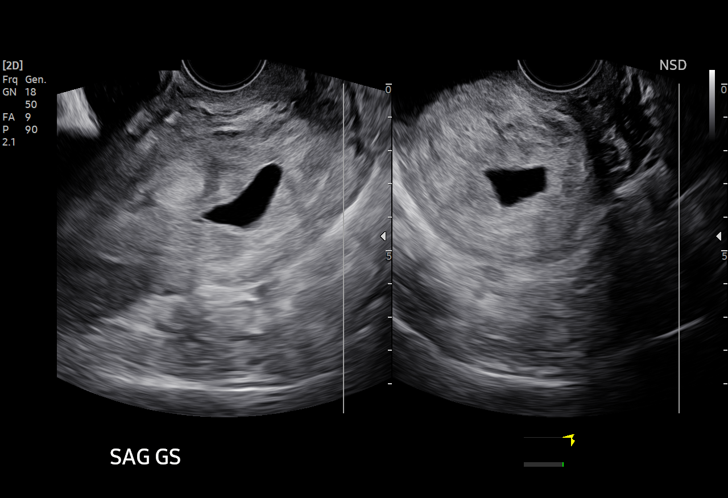
[im 10/13]
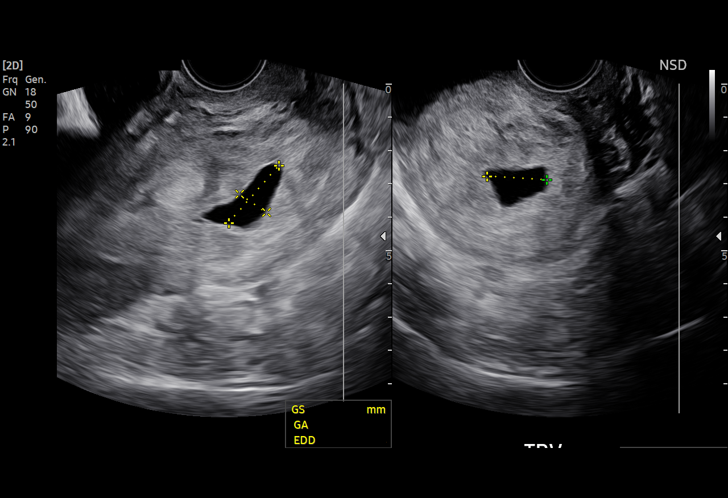
[im 11/13]
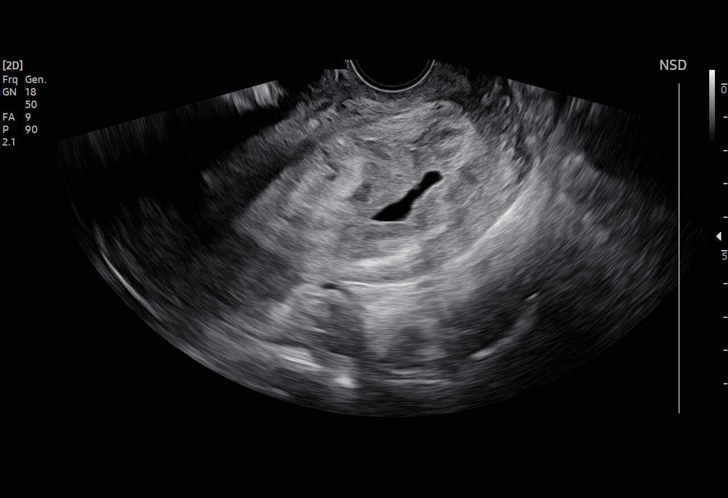
[im 12/13]
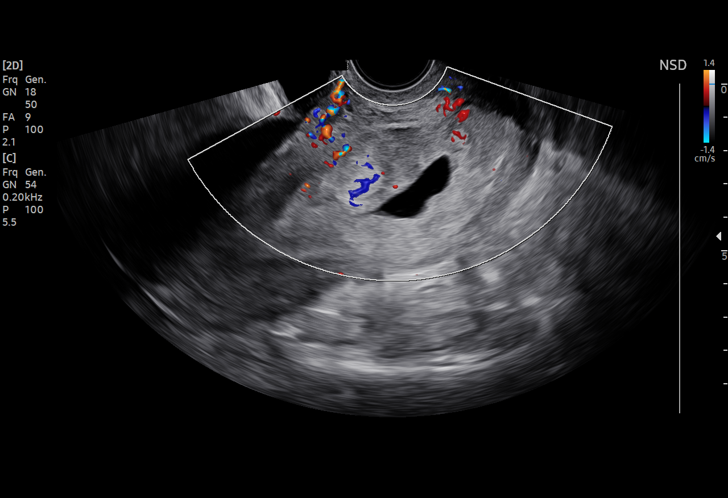
[im 13/13]
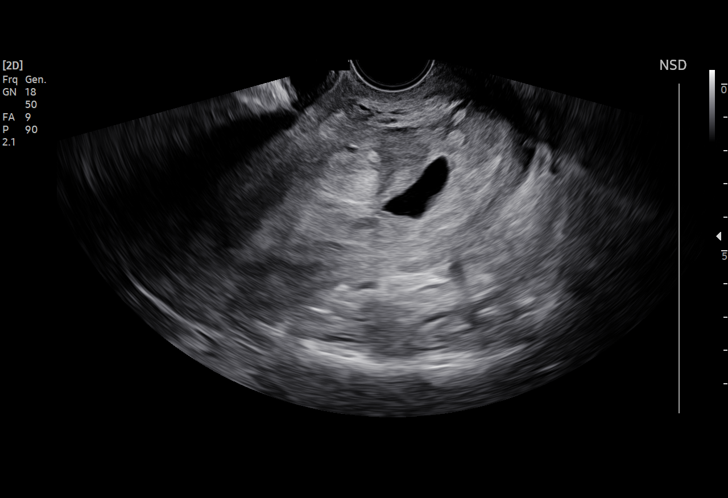

[13 of 13 positions shown; findings below may reference images not displayed]

FINDINGS: Intrauterine gestational sac: Single, irregular contour and located
in the lower uterine segment (image 10).

Yolk sac:  Not identified

Embryo:  Not identified

Cardiac Activity: Not applicable

MSD: 16.8 mm   6 w   4 d

Subchorionic hemorrhage: Suspected large volume of blood surrounding
the gestational sac in the endometrial cavity (image 10).

Maternal uterus/adnexae: No pelvic free fluid.

Neither ovary could be identified on initial transabdominal or
transvaginal attempts, and the examination had to be discontinued
prior to completion due to pain.
IMPRESSION: 1. Irregular intrauterine gestational sac located in the lower
uterine segment, with suspected large volume of surrounding
endometrial hemorrhage. No yolk sac or embryo identified. Suspect
spontaneous abortion in progress. Recommend serial quantitative beta
HCG and repeat Ultrasound as necessary.

2. No pelvic free fluid. Neither ovary could be identified initially
and the examination had to be discontinued due to pain.

## 2023-05-01 DIAGNOSIS — Z720 Tobacco use: Secondary | ICD-10-CM | POA: Diagnosis not present

## 2023-05-01 DIAGNOSIS — K429 Umbilical hernia without obstruction or gangrene: Secondary | ICD-10-CM | POA: Diagnosis not present

## 2023-11-25 DIAGNOSIS — Z111 Encounter for screening for respiratory tuberculosis: Secondary | ICD-10-CM | POA: Diagnosis not present

## 2023-12-04 DIAGNOSIS — Z113 Encounter for screening for infections with a predominantly sexual mode of transmission: Secondary | ICD-10-CM | POA: Diagnosis not present

## 2023-12-04 DIAGNOSIS — Z3202 Encounter for pregnancy test, result negative: Secondary | ICD-10-CM | POA: Diagnosis not present

## 2023-12-04 DIAGNOSIS — N898 Other specified noninflammatory disorders of vagina: Secondary | ICD-10-CM | POA: Diagnosis not present

## 2023-12-04 DIAGNOSIS — A599 Trichomoniasis, unspecified: Secondary | ICD-10-CM | POA: Diagnosis not present

## 2023-12-04 DIAGNOSIS — A64 Unspecified sexually transmitted disease: Secondary | ICD-10-CM | POA: Diagnosis not present

## 2023-12-04 DIAGNOSIS — N7689 Other specified inflammation of vagina and vulva: Secondary | ICD-10-CM | POA: Diagnosis not present

## 2024-04-02 DIAGNOSIS — R0602 Shortness of breath: Secondary | ICD-10-CM | POA: Diagnosis not present

## 2024-04-02 DIAGNOSIS — R509 Fever, unspecified: Secondary | ICD-10-CM | POA: Diagnosis not present

## 2024-04-02 DIAGNOSIS — E876 Hypokalemia: Secondary | ICD-10-CM | POA: Diagnosis not present

## 2024-04-02 DIAGNOSIS — M791 Myalgia, unspecified site: Secondary | ICD-10-CM | POA: Diagnosis not present

## 2024-04-02 DIAGNOSIS — R112 Nausea with vomiting, unspecified: Secondary | ICD-10-CM | POA: Diagnosis not present

## 2024-04-03 DIAGNOSIS — R0602 Shortness of breath: Secondary | ICD-10-CM | POA: Diagnosis not present
